# Patient Record
Sex: Female | Born: 1995 | Race: Black or African American | Hispanic: No | Marital: Married | Smoking: Current some day smoker
Health system: Southern US, Community
[De-identification: ages and names within clinical notes are randomized; demographics above are authoritative.]

## PROBLEM LIST (undated history)

## (undated) ENCOUNTER — Inpatient Hospital Stay (HOSPITAL_COMMUNITY): Payer: Self-pay

## (undated) DIAGNOSIS — N390 Urinary tract infection, site not specified: Secondary | ICD-10-CM

## (undated) DIAGNOSIS — Z789 Other specified health status: Secondary | ICD-10-CM

## (undated) DIAGNOSIS — G43909 Migraine, unspecified, not intractable, without status migrainosus: Secondary | ICD-10-CM

## (undated) HISTORY — PX: APPENDECTOMY: SHX54

---
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
## 2014-01-06 ENCOUNTER — Ambulatory Visit: Payer: Self-pay | Admitting: Physician Assistant

## 2014-01-15 ENCOUNTER — Emergency Department: Payer: Self-pay | Admitting: Emergency Medicine

## 2014-01-15 LAB — CBC
HCT: 38.3 % (ref 35.0–47.0)
HGB: 13.1 g/dL (ref 12.0–16.0)
MCH: 32.4 pg (ref 26.0–34.0)
MCHC: 34.2 g/dL (ref 32.0–36.0)
MCV: 95 fL (ref 80–100)
Platelet: 225 10*3/uL (ref 150–440)
RBC: 4.04 10*6/uL (ref 3.80–5.20)
RDW: 12.8 % (ref 11.5–14.5)
WBC: 6 10*3/uL (ref 3.6–11.0)

## 2014-01-15 LAB — URINALYSIS, COMPLETE
BACTERIA: NONE SEEN
BILIRUBIN, UR: NEGATIVE
BLOOD: NEGATIVE
Glucose,UR: NEGATIVE mg/dL (ref 0–75)
Ketone: NEGATIVE
NITRITE: NEGATIVE
Ph: 5 (ref 4.5–8.0)
Protein: NEGATIVE
Specific Gravity: 1.029 (ref 1.003–1.030)
WBC UR: 4 /HPF (ref 0–5)

## 2014-01-15 LAB — MAGNESIUM: Magnesium: 1.9 mg/dL

## 2014-01-15 LAB — COMPREHENSIVE METABOLIC PANEL
ALK PHOS: 88 U/L
ALT: 24 U/L (ref 12–78)
Albumin: 4.1 g/dL (ref 3.8–5.6)
Anion Gap: 2 — ABNORMAL LOW (ref 7–16)
BILIRUBIN TOTAL: 0.2 mg/dL (ref 0.2–1.0)
BUN: 15 mg/dL (ref 9–21)
CALCIUM: 8.8 mg/dL — AB (ref 9.0–10.7)
CREATININE: 0.69 mg/dL (ref 0.60–1.30)
Chloride: 112 mmol/L — ABNORMAL HIGH (ref 97–107)
Co2: 27 mmol/L — ABNORMAL HIGH (ref 16–25)
EGFR (African American): >60
GLUCOSE: 76 mg/dL (ref 65–99)
Osmolality: 281 (ref 275–301)
Potassium: 3.7 mmol/L (ref 3.3–4.7)
SGOT(AST): 25 U/L (ref 0–26)
Sodium: 141 mmol/L (ref 132–141)
Total Protein: 7.3 g/dL (ref 6.4–8.6)

## 2014-01-15 LAB — LIPASE, BLOOD: Lipase: 153 U/L (ref 73–393)

## 2014-01-15 LAB — PREGNANCY, URINE: Pregnancy Test, Urine: NEGATIVE m[IU]/mL

## 2014-01-23 ENCOUNTER — Emergency Department: Payer: Self-pay | Admitting: Emergency Medicine

## 2014-01-23 LAB — URINALYSIS, COMPLETE
Bacteria: NONE SEEN
Bilirubin,UR: NEGATIVE
Blood: NEGATIVE
GLUCOSE, UR: NEGATIVE mg/dL (ref 0–75)
Ketone: NEGATIVE
Leukocyte Esterase: NEGATIVE
Nitrite: NEGATIVE
Ph: 6 (ref 4.5–8.0)
Protein: NEGATIVE
SPECIFIC GRAVITY: 1.024 (ref 1.003–1.030)
Squamous Epithelial: 7
WBC UR: 1 /HPF (ref 0–5)

## 2014-01-23 LAB — COMPREHENSIVE METABOLIC PANEL
ALK PHOS: 87 U/L
ANION GAP: 4 — AB (ref 7–16)
Albumin: 3.8 g/dL (ref 3.8–5.6)
BUN: 12 mg/dL (ref 9–21)
Bilirubin,Total: 0.2 mg/dL (ref 0.2–1.0)
CREATININE: 0.85 mg/dL (ref 0.60–1.30)
Calcium, Total: 8.8 mg/dL — ABNORMAL LOW (ref 9.0–10.7)
Chloride: 107 mmol/L (ref 97–107)
Co2: 27 mmol/L — ABNORMAL HIGH (ref 16–25)
EGFR (African American): >60
EGFR (Non-African Amer.): >60
Glucose: 82 mg/dL (ref 65–99)
Osmolality: 275 (ref 275–301)
Potassium: 3.4 mmol/L (ref 3.3–4.7)
SGOT(AST): 18 U/L (ref 0–26)
SGPT (ALT): 18 U/L (ref 12–78)
SODIUM: 138 mmol/L (ref 132–141)
Total Protein: 7 g/dL (ref 6.4–8.6)

## 2014-01-23 LAB — CBC
HCT: 34.6 % — AB (ref 35.0–47.0)
HGB: 12.1 g/dL (ref 12.0–16.0)
MCH: 33.3 pg (ref 26.0–34.0)
MCHC: 35 g/dL (ref 32.0–36.0)
MCV: 95 fL (ref 80–100)
PLATELETS: 211 10*3/uL (ref 150–440)
RBC: 3.64 10*6/uL — ABNORMAL LOW (ref 3.80–5.20)
RDW: 12.5 % (ref 11.5–14.5)
WBC: 6.8 10*3/uL (ref 3.6–11.0)

## 2014-01-23 LAB — LIPASE, BLOOD: Lipase: 169 U/L (ref 73–393)

## 2014-02-15 IMAGING — US US OB COMP LESS 14 WK
1 series · 14 of 27 positions shown · non-contrast
Comparison: None.

CLINICAL DATA: Pain, pregnant.

OBSTETRIC <14 WK ULTRASOUND
TECHNIQUE: Transabdominal ultrasound was performed for evaluation
of the gestation as well as the maternal uterus and adnexal
regions.

[Series 1: us ob comp less 14 wks · 14 of 27 slices shown]
[im 1/27]
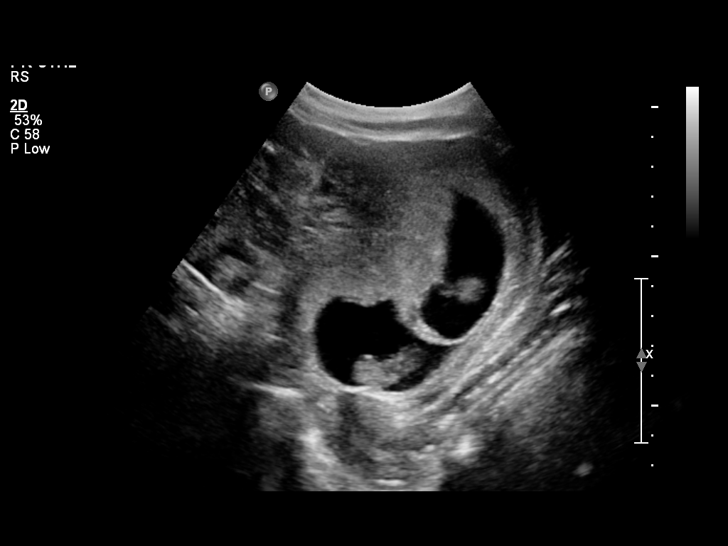
[im 3/27]
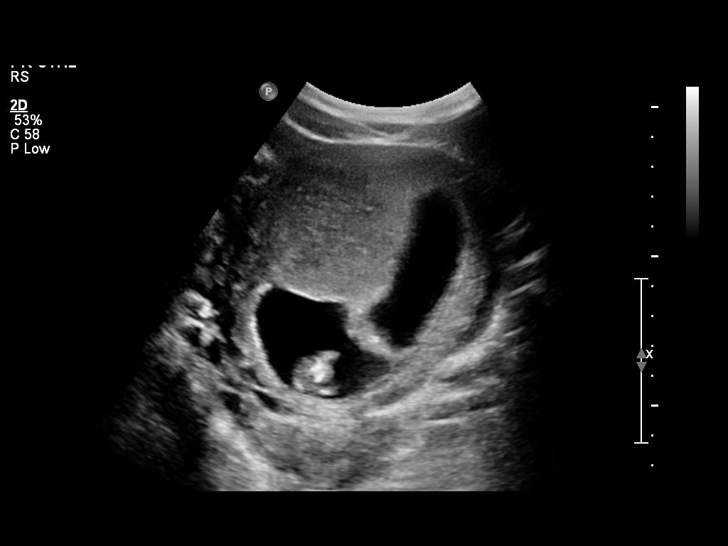
[im 5/27]
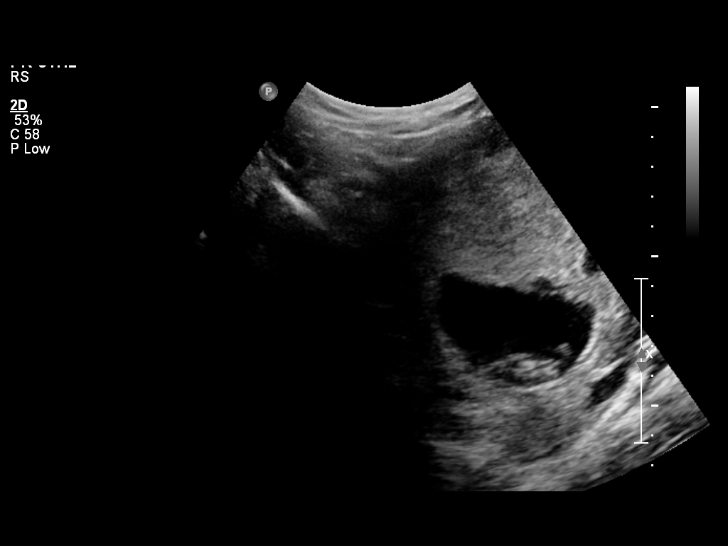
[im 7/27]
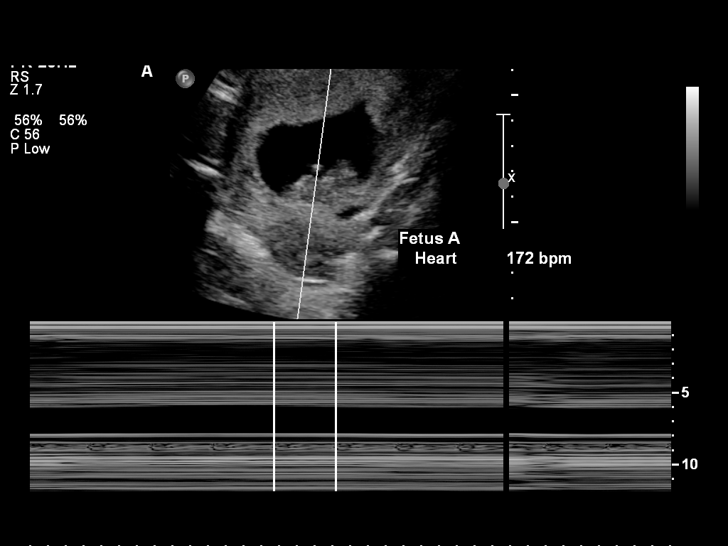
[im 9/27]
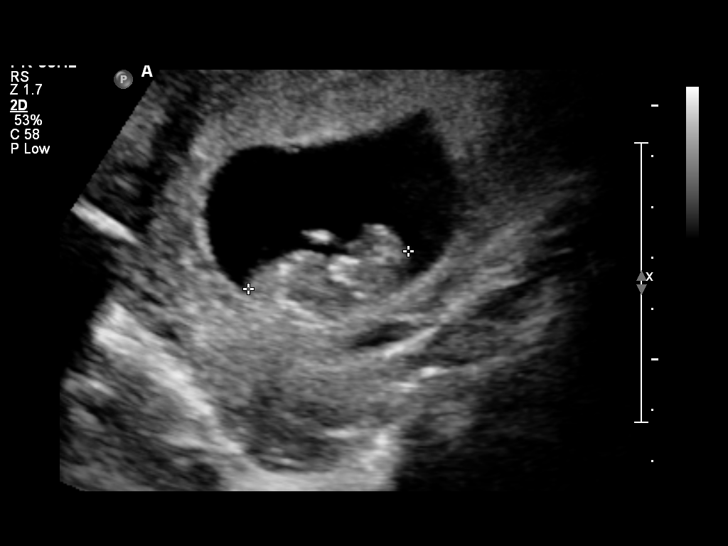
[im 11/27]
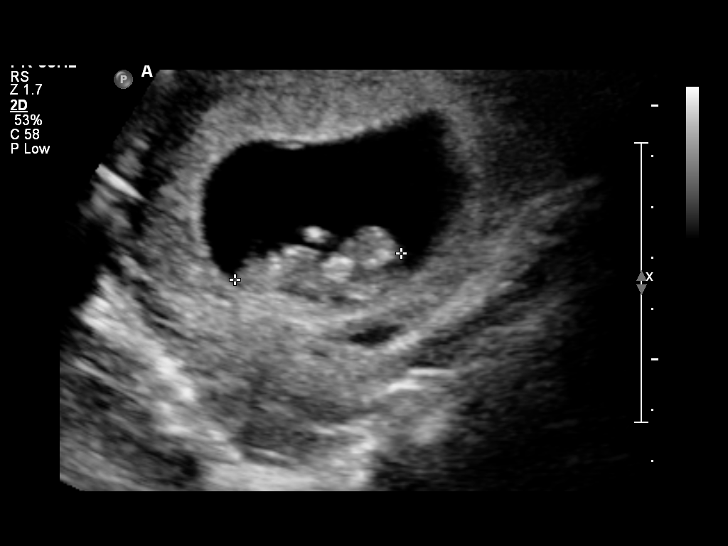
[im 13/27]
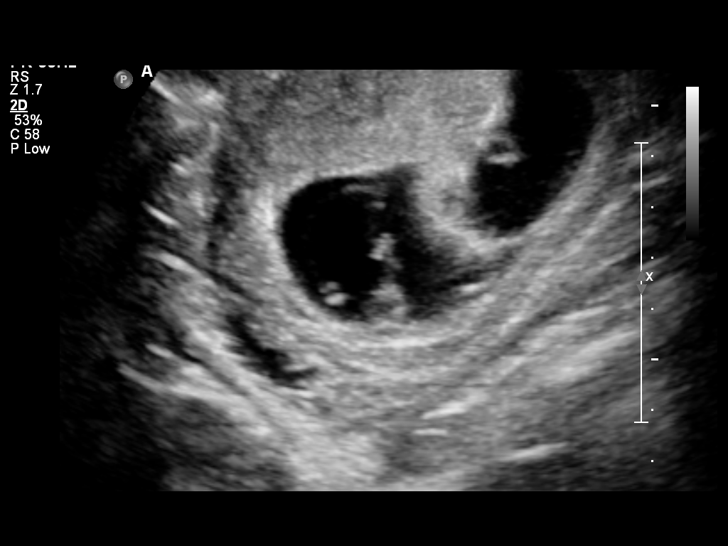
[im 15/27]
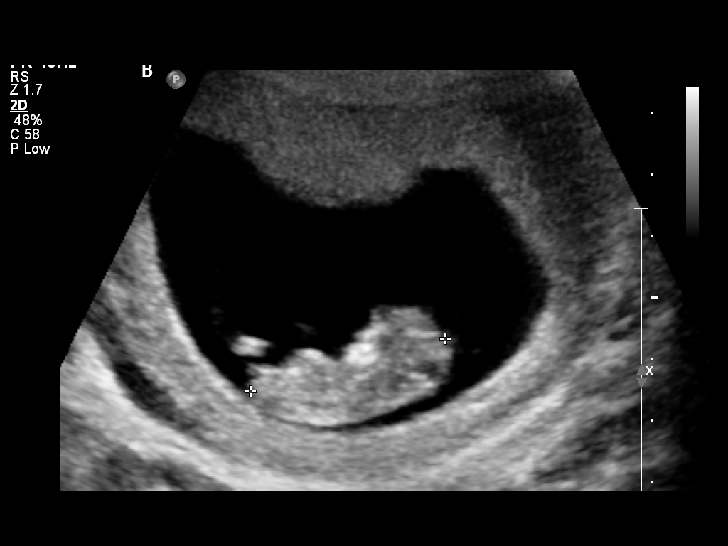
[im 17/27]
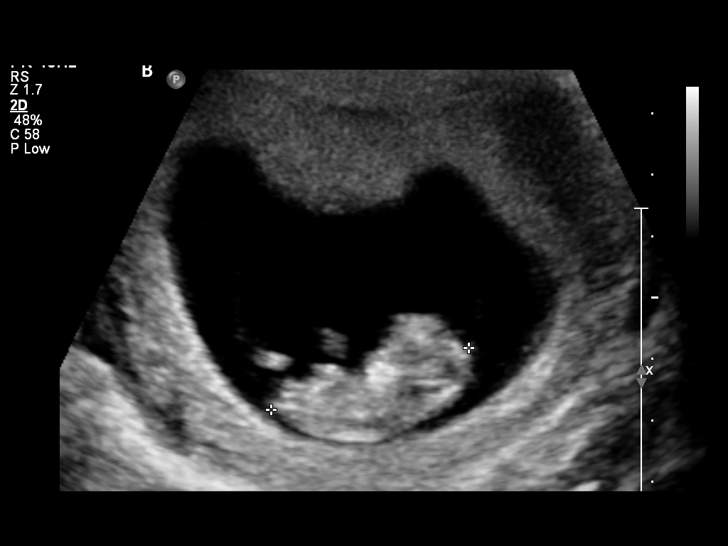
[im 19/27]
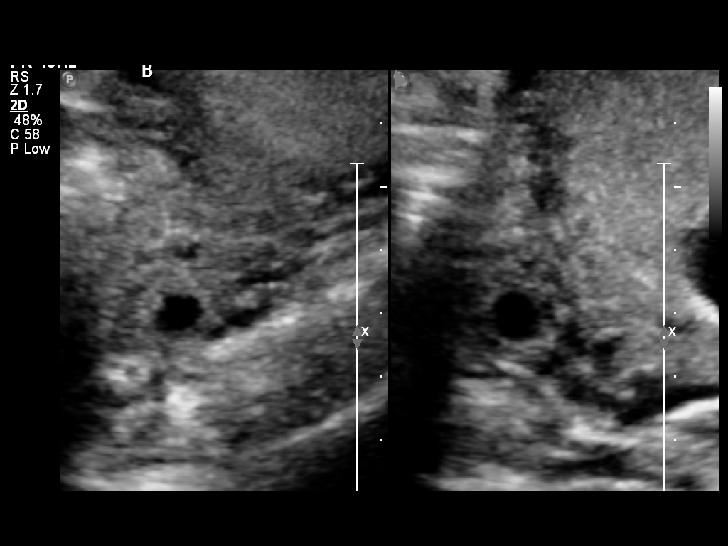
[im 21/27]
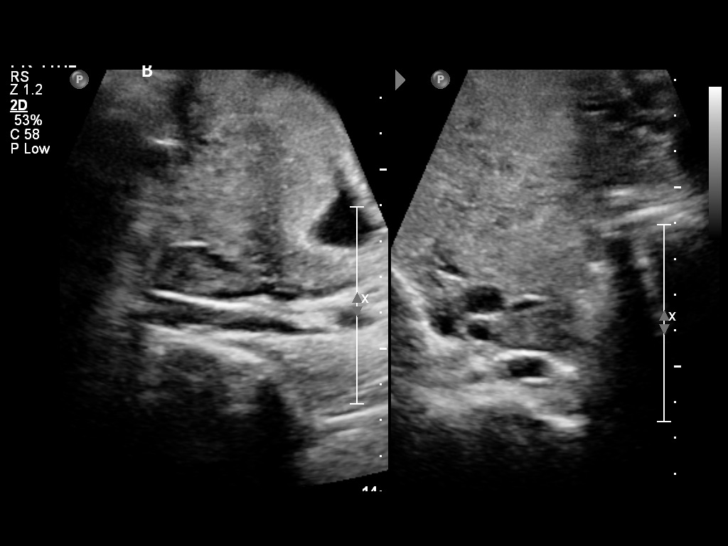
[im 23/27]
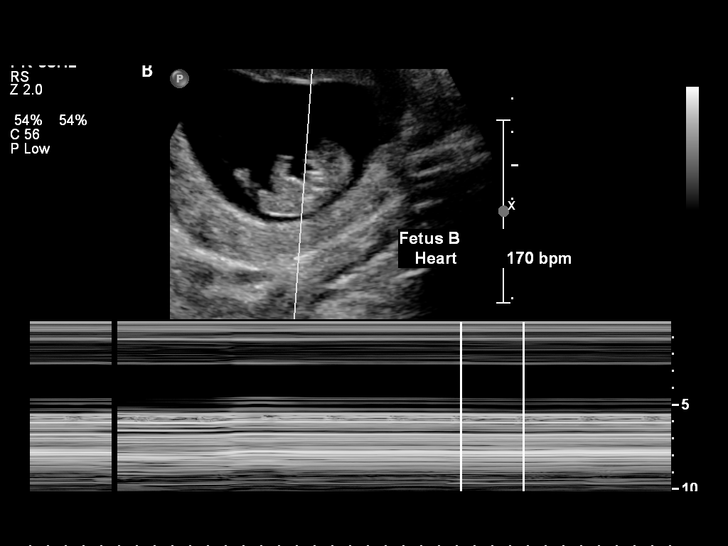
[im 25/27]
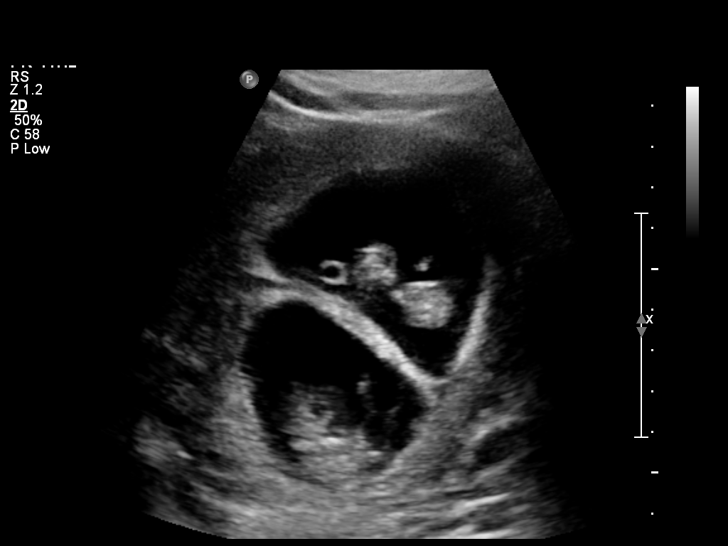
[im 27/27]
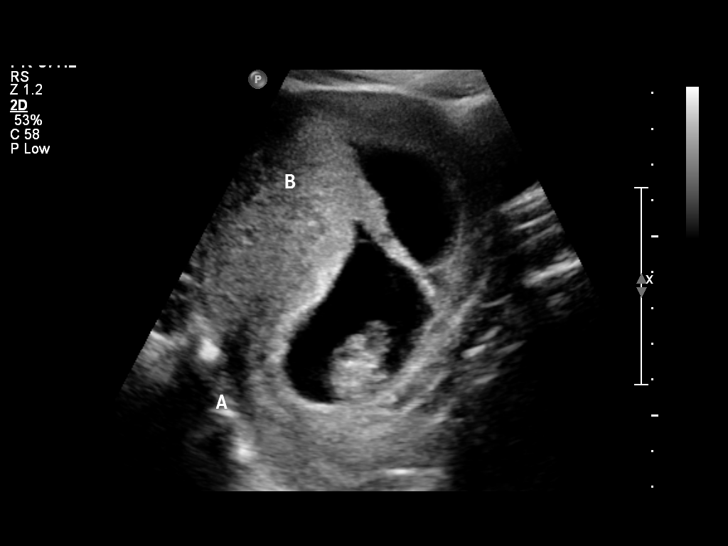

[14 of 27 positions shown; findings below may reference images not displayed]

Intrauterine gestational sac: 2 visualized, normal in shape.
Yolk sac: Identified
Embryo: Two identified
Cardiac Activity: Identified
Heart Rate: 172 and 170 bpm

CRL:  33 and 32 mm  10 w  1 d       US EDC: 02/10/2013

Maternal uterus/Adnexae:
No subchorionic hemorrhage.  Normal sonographic appearance to the
ovaries.
IMPRESSION: Twin gestation with cardiac activity documented for both.
Estimated ages of 10 weeks 1 day by crown-rump lengths.

## 2014-04-14 ENCOUNTER — Encounter: Payer: Self-pay | Admitting: Nurse Practitioner

## 2014-04-14 DIAGNOSIS — Z3046 Encounter for surveillance of implantable subdermal contraceptive: Secondary | ICD-10-CM

## 2014-05-06 ENCOUNTER — Encounter: Payer: Self-pay | Admitting: Obstetrics and Gynecology

## 2014-05-06 ENCOUNTER — Ambulatory Visit (INDEPENDENT_AMBULATORY_CARE_PROVIDER_SITE_OTHER): Payer: Medicaid Other | Admitting: Obstetrics and Gynecology

## 2014-05-06 VITALS — BP 123/69 | HR 83 | Ht 63.0 in | Wt 110.8 lb

## 2014-05-06 DIAGNOSIS — N898 Other specified noninflammatory disorders of vagina: Secondary | ICD-10-CM

## 2014-05-06 DIAGNOSIS — Z3009 Encounter for other general counseling and advice on contraception: Secondary | ICD-10-CM

## 2014-05-06 DIAGNOSIS — Z3046 Encounter for surveillance of implantable subdermal contraceptive: Secondary | ICD-10-CM

## 2014-05-06 MED ORDER — NORGESTIMATE-ETH ESTRADIOL 0.25-35 MG-MCG PO TABS: 1.0000 | ORAL_TABLET | Freq: Every day | ORAL | Status: DC

## 2014-05-06 NOTE — Progress Notes (Signed)
Patient would like nextplanon removed.  She is also having increased discharge with an odor that she would like checked out.

## 2014-05-06 NOTE — Progress Notes (Signed)
Patient ID: Debbie HarbourRaja [name], female   DOB: 05/01/1996, 18 y.o.   MRN: [id number] 18 yo G2P2003 requesting Nexplanon removal and plans to start OCP.  Patient also reports the presence of a vaginal discharge with odor    Removal Patient given informed consent for removal of her Implanon, time out was performed.  Signed copy in the chart.  Appropriate time out taken. Implanon site identified.  Area prepped in usual sterile fashon. One cc of 1% lidocaine was used to anesthetize the area at the distal end of the implant. A small stab incision was made right beside the implant on the distal portion.  The implanon rod was grasped using hemostats and removed without difficulty.  There was less than 3 cc blood loss. There were no complications.  A small amount of antibiotic ointment and steri-strips were applied over the small incision.  A pressure bandage was applied to reduce any bruising.  The patient tolerated the procedure well and was given post procedure instructions.  SSE: Normal vaginal mucosa and cervix. Normal white discharge Wet prep and GC/Chl cultures collected

## 2014-05-07 LAB — WET PREP FOR TRICH, YEAST, CLUE
Trich, Wet Prep: NONE SEEN
WBC, Wet Prep HPF POC: NONE SEEN
YEAST WET PREP: NONE SEEN

## 2014-05-07 MED ORDER — METRONIDAZOLE 500 MG PO TABS: 500.0000 mg | ORAL_TABLET | Freq: Two times a day (BID) | ORAL | Status: DC

## 2014-05-07 NOTE — Addendum Note (Signed)
Addended by: Catalina AntiguaONSTANT, PEGGY on: 05/07/2014 09:33 AM   Modules accepted: Orders

## 2014-05-09 LAB — GC/CHLAMYDIA PROBE AMP
CT Probe RNA: NEGATIVE
GC Probe RNA: NEGATIVE

## 2014-06-22 ENCOUNTER — Emergency Department: Payer: Self-pay | Admitting: Emergency Medicine

## 2014-07-13 IMAGING — US US OB FOLLOW-UP EACH ADDL GEST (MODIFY)
2 series · 15 of 28 positions shown · non-contrast
Comparison: none

[Series 1: us ob follow up · 1 of 2 slices shown (1 of 2)]
[im 1/2]
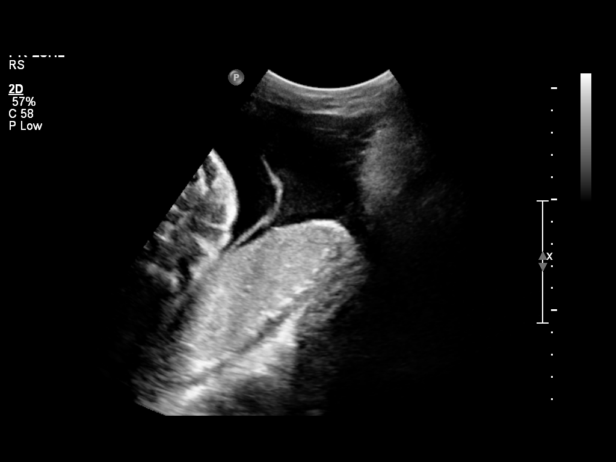

[Series 1: us ob follow up · 14 of 48 slices shown (2 of 2)]
[im 2/48]
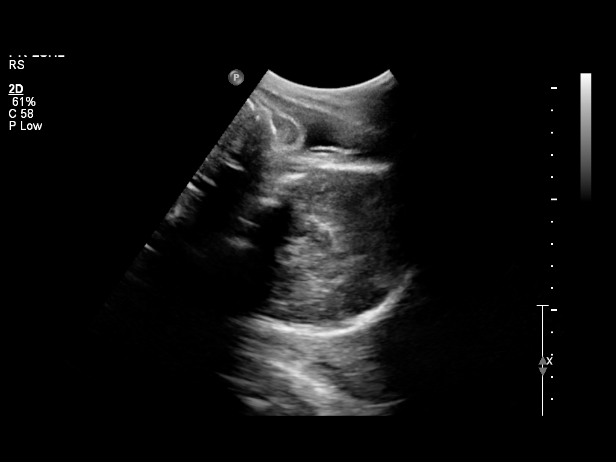
[im 6/48]
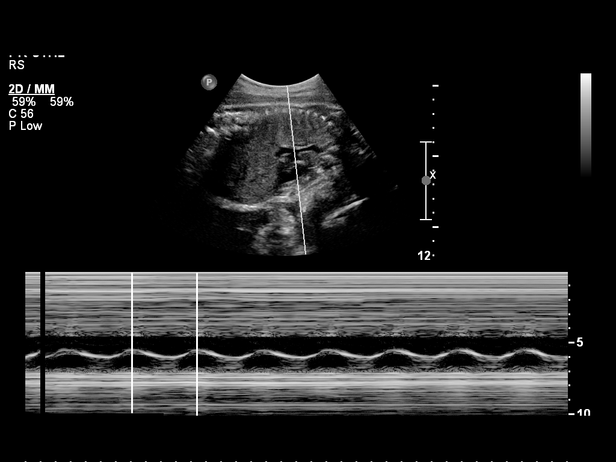
[im 10/48]
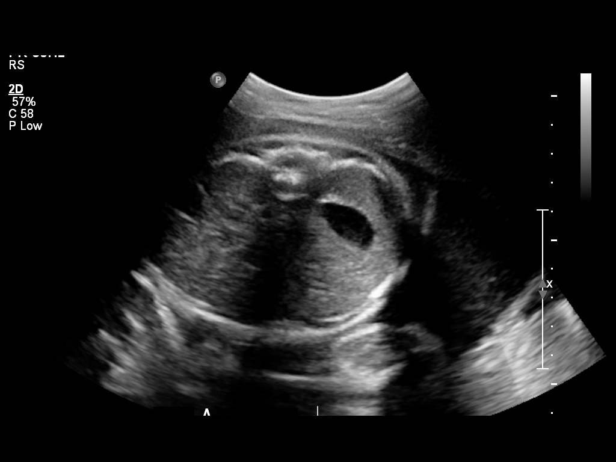
[im 13/48]
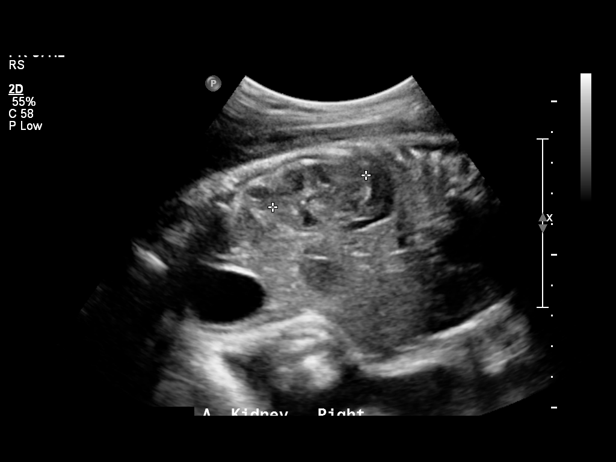
[im 17/48]
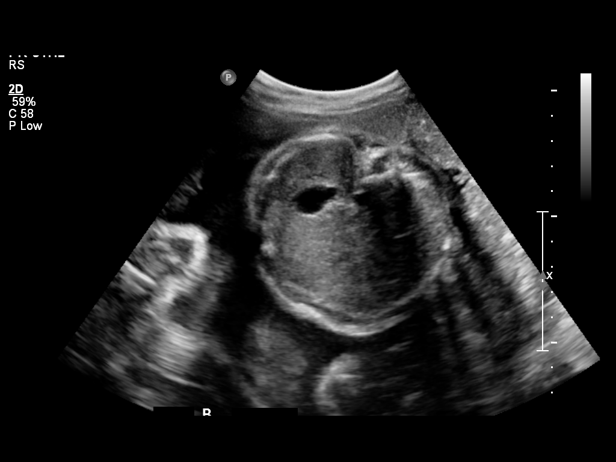
[im 20/48]
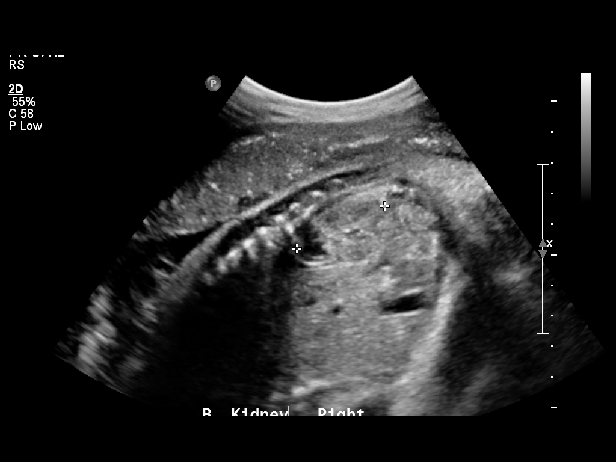
[im 24/48]
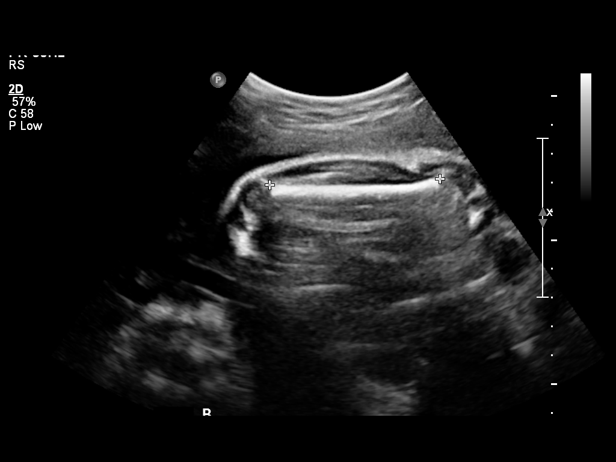
[im 26/48]
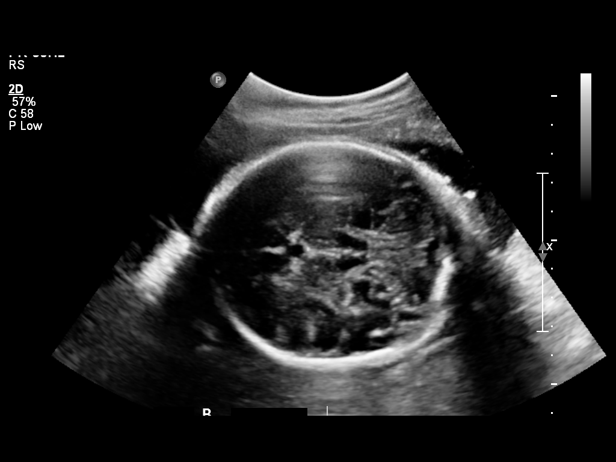
[im 29/48]
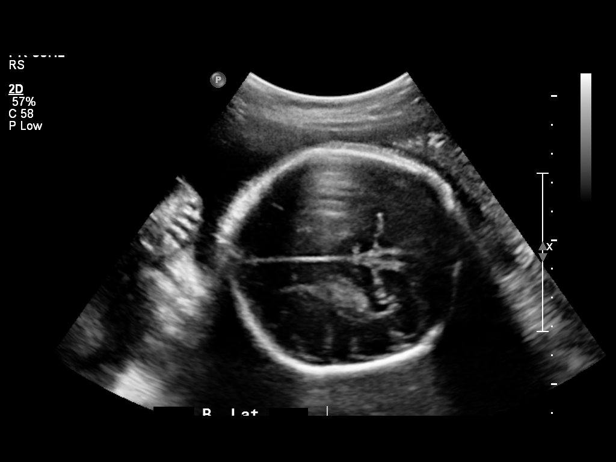
[im 33/48]
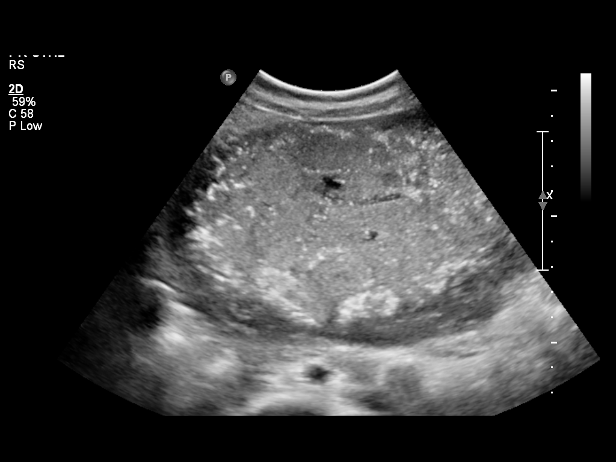
[im 37/48]
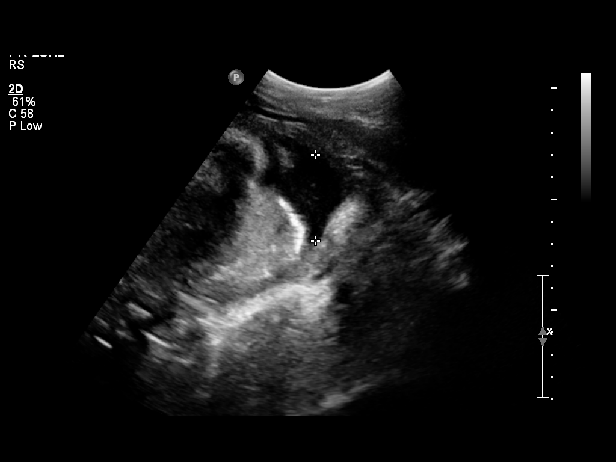
[im 40/48]
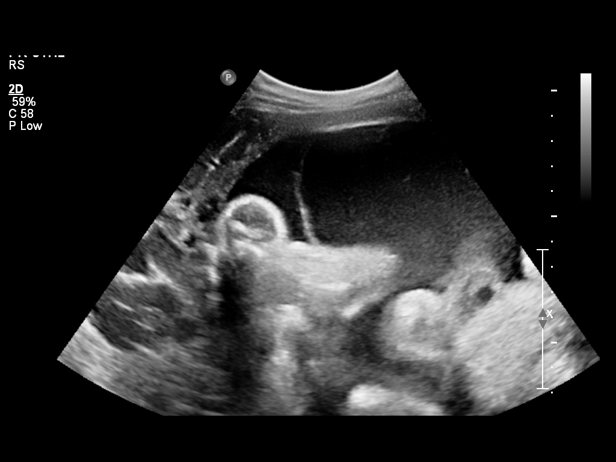
[im 44/48]
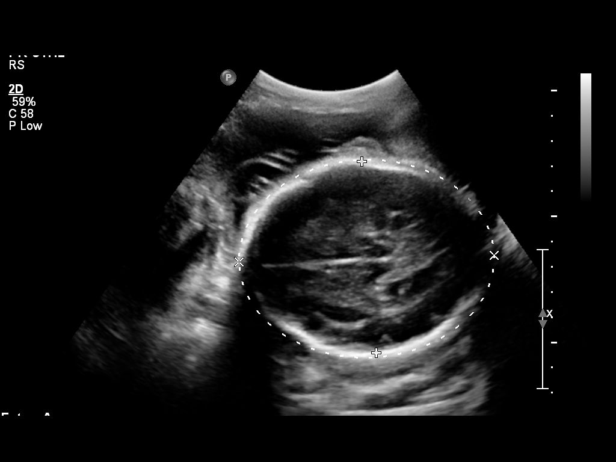
[im 48/48]
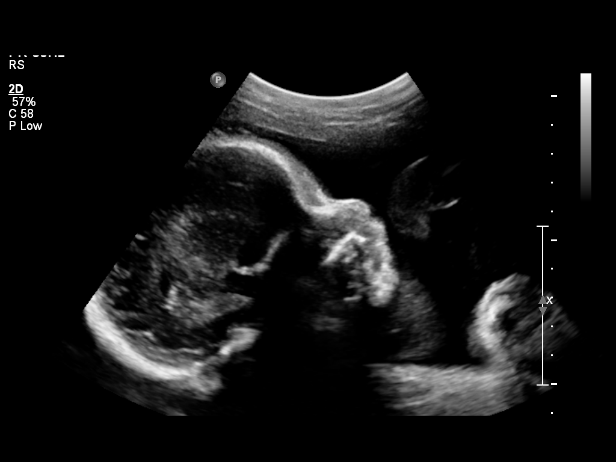

[15 of 28 positions shown; findings below may reference images not displayed]

OBSTETRICS REPORT
                      (Signed Final 12/12/2012 [DATE])

Service(s) Provided

 US OB FOLLOW UP                                       76816.1
 US OB FOLLOW UP ADDL GEST                             76816.2
Indications

 Twin gestation, Di-Di
 Family History of BONSU(Kaki)
 Cigarette smoker
Fetal Evaluation (Fetus A)

 Num Of Fetuses:    2
 Fetal Heart Rate:  139                         bpm
 Cardiac Activity:  Observed
 Fetal Lie:         INFERIOR Right Fetus
 Presentation:      Cephalic
 Placenta:          Posterior, above cervical
                    os
 P. Cord            Previously Visualized
 Insertion:

 Amniotic Fluid
 AFI FV:      Subjectively within normal limits
                                             Larg Pckt:     4.2  cm
Biometry (Fetus A)

 BPD:     77.7  mm    G. Age:   31w 1d                CI:        77.65   70 - 86
                                                      FL/HC:      21.6   19.3 -

 HC:     279.1  mm    G. Age:   30w 4d       10  %    HC/AC:      1.09   0.96 -

 AC:     255.8  mm    G. Age:   29w 5d       18  %    FL/BPD:     77.7   71 - 87
 FL:      60.4  mm    G. Age:   31w 3d       51  %    FL/AC:      23.6   20 - 24

 Est. FW:    1336  gm      3 lb 8 oz     47  %     FW Discordancy         6  %
Gestational Age (Fetus A)

 LMP:           30w 6d       Date:   05/10/12                 EDD:   02/14/13
 U/S Today:     30w 5d                                        EDD:   02/15/13
 Best:          30w 6d    Det. By:   LMP  (05/10/12)          EDD:   02/14/13
Anatomy (Fetus A)

 Cranium:          Previously seen        Aortic Arch:      Previously seen
 Fetal Cavum:      Previously seen        Ductal Arch:      Previously seen
 Ventricles:       Appears normal         Diaphragm:        Previously seen
 Choroid Plexus:   Previously seen        Stomach:          Appears normal, left
                                                            sided
 Cerebellum:       Previously seen        Abdomen:          Previously seen
 Posterior Fossa:  Previously seen        Abdominal Wall:   Previously seen
 Nuchal Fold:      Previously seen        Cord Vessels:     Previously seen
 Face:             Orbits previously      Kidneys:          Appear normal
                   seen
 Lips:             Previously seen        Bladder:          Appears normal
 Heart:            Previously seen        Spine:            Previously seen
 RVOT:             Previously seen        Lower             Previously seen
                                          Extremities:
 LVOT:             Previously seen        Upper             Previously seen
                                          Extremities:

 Other:  Female gender.  Heels and 5th digit previously seen.

Fetal Evaluation (Fetus B)

 Num Of Fetuses:    2
 Fetal Heart Rate:  135                         bpm
 Cardiac Activity:  Observed
 Fetal Lie:         SUPERIOR
 Presentation:      Transverse, head to
                    maternal right
 Placenta:          Posterior Fundal, above
                    cervical os
 P. Cord            Previously Visualized
 Insertion:

 Amniotic Fluid
 AFI FV:      Subjectively within normal limits
                                             Larg Pckt:     6.5  cm
Biometry (Fetus B)

 BPD:     75.4  mm    G. Age:   30w 2d                CI:        74.86   70 - 86
                                                      FL/HC:      21.3   19.3 -

 HC:     276.5  mm    G. Age:   30w 2d        7  %    HC/AC:      1.01   0.96 -

 AC:     273.2  mm    G. Age:   31w 3d       62  %    FL/BPD:     78.0   71 - 87
 FL:      58.8  mm    G. Age:   30w 5d       31  %    FL/AC:      21.5   20 - 24

 Est. FW:    4413  gm    3 lb 11 oz      57  %     FW Discordancy      0 \ 6 %
Gestational Age (Fetus B)

 LMP:           30w 6d       Date:   05/10/12                 EDD:   02/14/13
 U/S Today:     30w 5d                                        EDD:   02/15/13
 Best:          30w 6d    Det. By:   LMP  (05/10/12)          EDD:   02/14/13
Anatomy (Fetus B)

 Cranium:          Previously seen        Aortic Arch:      Previously seen
 Fetal Cavum:      Previously seen        Ductal Arch:      Previously seen
 Ventricles:       Appears normal         Diaphragm:        Previously seen
 Choroid Plexus:   Previously seen        Stomach:          Appears normal, left
                                                            sided
 Cerebellum:       Previously seen        Abdomen:          Previously seen
 Posterior Fossa:  Previously seen        Abdominal Wall:   Previously seen
 Nuchal Fold:      Previously seen        Cord Vessels:     Previously seen
 Face:             Orbits previously      Kidneys:          Appear normal
                   seen
 Lips:             Previously seen        Bladder:          Appears normal
 Heart:            Previously seen        Spine:            Previously seen
 RVOT:             Not well visualized    Lower             Previously seen
                                          Extremities:
 LVOT:             Previously seen        Upper             Previously seen
                                          Extremities:

 Other:  Female gender. Heels and 5th digit previously visualized.
Cervix Uterus Adnexa

 Cervical Length:   3         cm

 Cervix:       Normal appearance by transabdominal scan.

 Adnexa:     No abnormality visualized.
Impression

 Diamniotic, dichorionic twin gestation with twin A
 demonstrating an EGA by US of 30w 5 and twin B
 demonstrating an EGA by US of 30w 5d. This is correlated
 with expected EGA by LMP of 30w 6d. EFW is currently at
 the 47% for twin A and 57% for twin B with weight
 discrepancy due to a smaller abdominal circumference on
 twin A (18%). Continued close follow up for growth is
 recommended given the downward trending AC on twin A
 today.

 No late developing anatomic abnormalities are seen
 associated with the lateral ventricles, stomach, kidney or
 bladder for either twin. The four chamber heart could not be
 well seen on either twin.

 Subjectively and quantitatively normal amniotic fluid volume
 noted for both twins.

 Normal cervical length and appearance.

 questions or concerns.

## 2014-08-31 ENCOUNTER — Telehealth: Payer: Self-pay | Admitting: *Deleted

## 2014-08-31 DIAGNOSIS — N760 Acute vaginitis: Principal | ICD-10-CM

## 2014-08-31 DIAGNOSIS — B9689 Other specified bacterial agents as the cause of diseases classified elsewhere: Secondary | ICD-10-CM

## 2014-08-31 MED ORDER — METRONIDAZOLE 500 MG PO TABS: 500.0000 mg | ORAL_TABLET | Freq: Two times a day (BID) | ORAL | Status: DC

## 2014-08-31 NOTE — Telephone Encounter (Signed)
Patient is requesting a refill of Metronidazole for BV symptoms, she has changed her soap and is having this problem much less than before but sometimes she still has the increase discharge and odor.

## 2014-09-21 ENCOUNTER — Encounter: Payer: Self-pay | Admitting: Obstetrics and Gynecology

## 2014-10-13 ENCOUNTER — Encounter: Payer: Self-pay | Admitting: Nurse Practitioner

## 2014-10-13 ENCOUNTER — Ambulatory Visit (INDEPENDENT_AMBULATORY_CARE_PROVIDER_SITE_OTHER): Payer: Medicaid Other | Admitting: Nurse Practitioner

## 2014-10-13 VITALS — BP 117/74 | HR 79 | Ht 63.0 in | Wt 110.0 lb

## 2014-10-13 DIAGNOSIS — Z3041 Encounter for surveillance of contraceptive pills: Secondary | ICD-10-CM | POA: Diagnosis not present

## 2014-10-13 DIAGNOSIS — Z113 Encounter for screening for infections with a predominantly sexual mode of transmission: Secondary | ICD-10-CM

## 2014-10-13 DIAGNOSIS — Z7251 High risk heterosexual behavior: Secondary | ICD-10-CM | POA: Diagnosis not present

## 2014-10-13 DIAGNOSIS — Z309 Encounter for contraceptive management, unspecified: Secondary | ICD-10-CM | POA: Insufficient documentation

## 2014-10-13 LAB — POCT URINE PREGNANCY: PREG TEST UR: NEGATIVE

## 2014-10-13 NOTE — Progress Notes (Signed)
Would like screening for STI and pregnancy test as her last cycle was very light and she has not taken her ocp.

## 2014-10-13 NOTE — Addendum Note (Signed)
Addended by: Barbara CowerNOGUES, Lesta Limbert L on: 10/13/2014 04:12 PM   Modules accepted: Orders

## 2014-10-13 NOTE — Addendum Note (Signed)
Addended by: Barbara CowerNOGUES, Yadier Bramhall L on: 10/13/2014 04:01 PM   Modules accepted: Orders

## 2014-10-13 NOTE — Progress Notes (Signed)
Patient decided she would like to try the [employer] Ring.  One sample is given for patient to take home to see if she likes it and she will call me back to have it called in if she is interested in continuing this method of birth control.

## 2014-10-13 NOTE — Patient Instructions (Signed)

## 2014-10-13 NOTE — Progress Notes (Signed)
History:  Debbie EdinRaja Quanah [name] is a 18 y.o. [phone] who [facility] for [facility]D screening and contraception management. She states her boyfriend cheated on her and she is having an odorous discharge for the last few days. She has not used any OTC medications for the smell. She is having unprotected intercourse though she has birth control pills.   The following portions of the patient's history were reviewed and updated as appropriate: allergies, current medications, past family history, past medical history, past social history, past surgical history and problem list.  Review of Systems:    Objective:  Physical Exam BP 117/74 mmHg  Pulse 79  Ht 5\' 3"  (1.6 m)  Wt 110 lb (49.896 kg)  BMI 19.49 kg/m2  LMP 10/06/2014 (Exact Date)  Breastfeeding? No GENERAL: Well-developed, well-nourished female in no acute distress.  HEENT: Normocephalic, atraumatic.  ABDOMEN: Soft, nontender, nondistended. No organomegaly. Normal bowel sounds appreciated in all quadrants.  PELVIC: Normal external female genitalia. Vagina is pink and rugated.  Discharge is greenish with odor. . Normal cervix contour.  Uterus is normal in size. No adnexal mass , tenderness in left adnexa EXTREMITIES: No cyanosis, clubbing, or edema, 2+ distal pulses.   Labs and Imaging No results found.  Negative UPT  Assessment & Plan:  Assessment:  STD screening Contraception Management  Plans: Will do complete screening/ she will be called with results Discussion concerning importance of taking BCP's daily RTC prn  Debbie PhenixLinda M Vennie Waymire, NP 10/13/2014 11:43 AM

## 2014-10-14 ENCOUNTER — Telehealth: Payer: Self-pay | Admitting: *Deleted

## 2014-10-14 DIAGNOSIS — A749 Chlamydial infection, unspecified: Secondary | ICD-10-CM

## 2014-10-14 LAB — RPR

## 2014-10-14 LAB — WET PREP FOR TRICH, YEAST, CLUE
Clue Cells Wet Prep HPF POC: NONE SEEN
Trich, Wet Prep: NONE SEEN
Yeast Wet Prep HPF POC: NONE SEEN

## 2014-10-14 LAB — HIV ANTIBODY (ROUTINE TESTING W REFLEX): HIV 1&2 Ab, 4th Generation: NONREACTIVE

## 2014-10-14 LAB — HEPATITIS C ANTIBODY: HCV Ab: NEGATIVE

## 2014-10-14 LAB — GC/CHLAMYDIA PROBE AMP, URINE
Chlamydia, Swab/Urine, PCR: NEGATIVE
GC Probe Amp, Urine: POSITIVE — AB

## 2014-10-14 LAB — HEPATITIS B SURFACE ANTIGEN: Hepatitis B Surface Ag: NEGATIVE

## 2014-10-14 MED ORDER — AZITHROMYCIN 500 MG PO TABS: ORAL_TABLET | ORAL | Status: DC

## 2014-10-14 NOTE — Telephone Encounter (Signed)
Patient is notified of positive chlamydia results.  I have called in medication for her and she will take it today.  She will notify her partner to be treated and obstain from intercourse.

## 2014-10-19 ENCOUNTER — Ambulatory Visit: Payer: Medicaid Other

## 2014-10-20 ENCOUNTER — Ambulatory Visit (INDEPENDENT_AMBULATORY_CARE_PROVIDER_SITE_OTHER): Payer: Medicaid Other | Admitting: *Deleted

## 2014-10-20 ENCOUNTER — Ambulatory Visit: Payer: Medicaid Other

## 2014-10-20 DIAGNOSIS — A549 Gonococcal infection, unspecified: Secondary | ICD-10-CM

## 2014-10-20 MED ORDER — CEFTRIAXONE SODIUM 1 G IJ SOLR
250.0000 mg | Freq: Once | INTRAMUSCULAR | Status: AC
  Administered 2014-10-20: 250 mg via INTRAMUSCULAR

## 2014-10-29 ENCOUNTER — Other Ambulatory Visit: Payer: Self-pay | Admitting: *Deleted

## 2014-10-29 DIAGNOSIS — B379 Candidiasis, unspecified: Secondary | ICD-10-CM

## 2014-10-29 MED ORDER — FLUCONAZOLE 150 MG PO TABS: 150.0000 mg | ORAL_TABLET | Freq: Once | ORAL | Status: DC

## 2014-10-29 NOTE — Telephone Encounter (Signed)
Pt called and thinks she may have a yeast infection due to taking antibiotics.  I have sent in Diflucan to her pharmacy.

## 2014-11-01 ENCOUNTER — Ambulatory Visit: Payer: Self-pay | Admitting: Physician Assistant

## 2014-11-09 ENCOUNTER — Ambulatory Visit: Payer: Self-pay | Admitting: Physician Assistant

## 2015-02-11 ENCOUNTER — Telehealth: Payer: Self-pay | Admitting: *Deleted

## 2015-02-11 DIAGNOSIS — B9689 Other specified bacterial agents as the cause of diseases classified elsewhere: Secondary | ICD-10-CM

## 2015-02-11 DIAGNOSIS — N760 Acute vaginitis: Principal | ICD-10-CM

## 2015-02-11 MED ORDER — METRONIDAZOLE 500 MG PO TABS: 500.0000 mg | ORAL_TABLET | Freq: Two times a day (BID) | ORAL | Status: DC

## 2015-02-11 NOTE — Telephone Encounter (Signed)
Patient called and needed something called in for bacterial vaginosis.  I have sent in Flagyl to patients pharmacy. Pt aware.

## 2015-04-21 ENCOUNTER — Telehealth: Payer: Self-pay | Admitting: *Deleted

## 2015-04-21 DIAGNOSIS — B9689 Other specified bacterial agents as the cause of diseases classified elsewhere: Secondary | ICD-10-CM

## 2015-04-21 DIAGNOSIS — N760 Acute vaginitis: Principal | ICD-10-CM

## 2015-04-21 MED ORDER — METRONIDAZOLE 500 MG PO TABS: 500.0000 mg | ORAL_TABLET | Freq: Two times a day (BID) | ORAL | Status: DC

## 2015-04-21 NOTE — Telephone Encounter (Signed)
Patient called and needs something sent in for bacterial vaginosis.  I have sent in Flagyl to patients pharmacy.

## 2015-05-21 ENCOUNTER — Telehealth: Payer: Self-pay | Admitting: Family Medicine

## 2015-05-21 DIAGNOSIS — B379 Candidiasis, unspecified: Secondary | ICD-10-CM

## 2015-05-21 MED ORDER — FLUCONAZOLE 150 MG PO TABS: 150.0000 mg | ORAL_TABLET | Freq: Once | ORAL | Status: DC

## 2015-05-21 NOTE — Telephone Encounter (Signed)
Patient called requesting Diflucan for a yeast infection.  Rx routed to pharmacy per protocol.

## 2015-06-18 ENCOUNTER — Ambulatory Visit: Payer: Medicaid Other | Admitting: Obstetrics & Gynecology

## 2015-06-18 ENCOUNTER — Ambulatory Visit (INDEPENDENT_AMBULATORY_CARE_PROVIDER_SITE_OTHER): Payer: Medicaid Other | Admitting: Obstetrics & Gynecology

## 2015-06-18 ENCOUNTER — Encounter: Payer: Self-pay | Admitting: Obstetrics & Gynecology

## 2015-06-18 ENCOUNTER — Other Ambulatory Visit: Payer: Self-pay | Admitting: Obstetrics & Gynecology

## 2015-06-18 VITALS — BP 132/85 | HR 67 | Wt 115.0 lb

## 2015-06-18 DIAGNOSIS — N898 Other specified noninflammatory disorders of vagina: Secondary | ICD-10-CM | POA: Diagnosis not present

## 2015-06-18 DIAGNOSIS — N925 Other specified irregular menstruation: Secondary | ICD-10-CM

## 2015-06-18 DIAGNOSIS — Z3009 Encounter for other general counseling and advice on contraception: Secondary | ICD-10-CM

## 2015-06-18 DIAGNOSIS — Z113 Encounter for screening for infections with a predominantly sexual mode of transmission: Secondary | ICD-10-CM | POA: Diagnosis not present

## 2015-06-18 LAB — POCT URINE PREGNANCY: Preg Test, Ur: NEGATIVE

## 2015-06-18 NOTE — Progress Notes (Signed)
   Subjective:    Patient ID: Debbie [name], female    DOB: 25-May-1996, 19 y.o.   MRN: [id number]  HPI  19 yo S mixed P3 here because her current partner "cheated on" her. She has been having some vaginal discharge. She also said that her LMP was shorter than normal and she is worried that she could be pregnant.She declines contraception. I mentioned every type available and she declines each one. But also tells me that she doesn't want a pregnancy right now.  Review of Systems     Objective:   Physical Exam  WNWHtatooed F NAD Breathing, conversing, and ambulating normally EG- shaved, no lesions Thinnish clear vaginal discharge Bimanual exam reveals some tenderness on the right but no masses (I suspect ovulation pain)      Assessment & Plan:  Vaginal discharge- check cervical cultures and wet prep We had a long discussion about contraception.

## 2015-06-19 LAB — WET PREP, GENITAL
TRICH WET PREP: NONE SEEN
Yeast Wet Prep HPF POC: NONE SEEN

## 2015-06-19 LAB — GC/CHLAMYDIA PROBE AMP
CT Probe RNA: NEGATIVE
GC PROBE AMP APTIMA: NEGATIVE

## 2015-06-23 ENCOUNTER — Telehealth: Payer: Self-pay | Admitting: *Deleted

## 2015-06-23 DIAGNOSIS — N898 Other specified noninflammatory disorders of vagina: Secondary | ICD-10-CM

## 2015-06-23 MED ORDER — METRONIDAZOLE 500 MG PO TABS: 500.0000 mg | ORAL_TABLET | Freq: Two times a day (BID) | ORAL | Status: DC

## 2015-06-23 NOTE — Telephone Encounter (Signed)
Informed pt of results of wet prep and GC/CT, pt is c/o foul smelling discharge.  Sent Flagyl  to pharmacy for tx of BV.

## 2015-07-22 ENCOUNTER — Telehealth: Payer: Self-pay | Admitting: *Deleted

## 2015-07-22 DIAGNOSIS — N898 Other specified noninflammatory disorders of vagina: Secondary | ICD-10-CM

## 2015-07-22 MED ORDER — METRONIDAZOLE 500 MG PO TABS: 500.0000 mg | ORAL_TABLET | Freq: Two times a day (BID) | ORAL | Status: DC

## 2015-07-22 NOTE — Telephone Encounter (Signed)
Patient called and said some of her medicine fell down the sink into the water and she needs more medicine called in because she feels like she still has the infection.  I have sent in more medicine but made patient aware that if symptoms persist after finishing medicine that she needs to schedule another office visit.

## 2015-07-27 ENCOUNTER — Telehealth: Payer: Self-pay | Admitting: Obstetrics & Gynecology

## 2015-07-27 NOTE — Telephone Encounter (Signed)
Pt was wondering if she could have something called in for a yeast infection? Pt uses the [facility] in [place].

## 2015-07-27 NOTE — Telephone Encounter (Signed)
Pt still c/o vaginal discharge, had rx a week ago and spilled part of the medication in the sink of water.  Pt had called and spoke to Chrissie, CMA and she had called another rx into pharmacy but was unaware that the rx had been called in.  Told pt to check with pharmacy and pick up rx.  Pt to call back if there is any trouble with the rx or if she continues to be symptomatic after treatment.

## 2015-08-07 IMAGING — CR DG TOE 5TH LEFT
1 series · 4 of 4 positions shown · non-contrast
Comparison: None.

CLINICAL DATA: Pain secondary to blunt trauma last night.

EXAM:
DG TOE 5TH LEFT

[Series 1: oblique · 0.17mm/px · 4 of 4 slices shown]
[im 1/4]
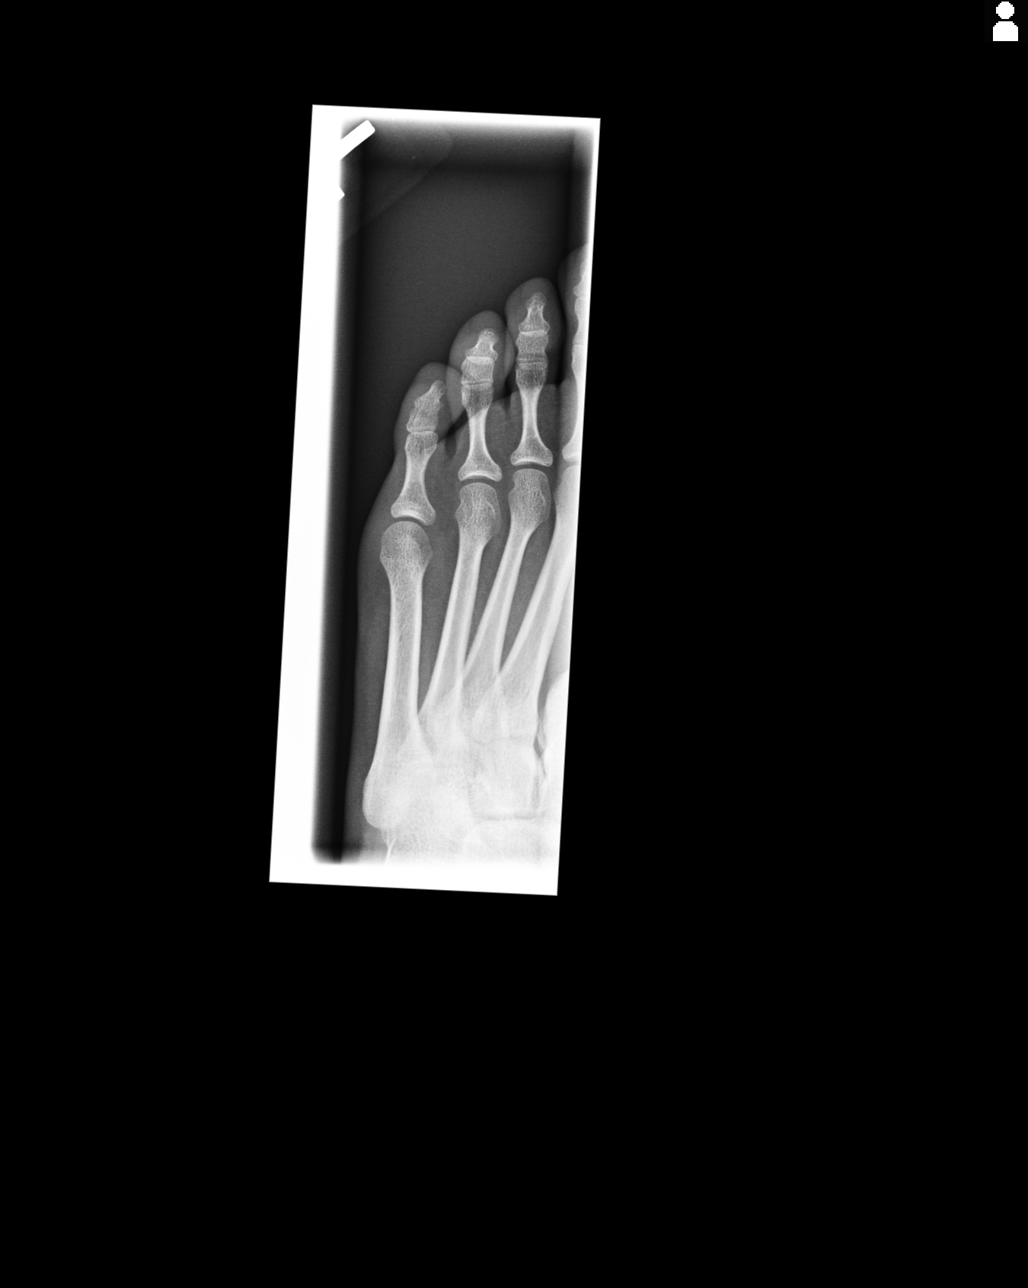
[im 2/4]
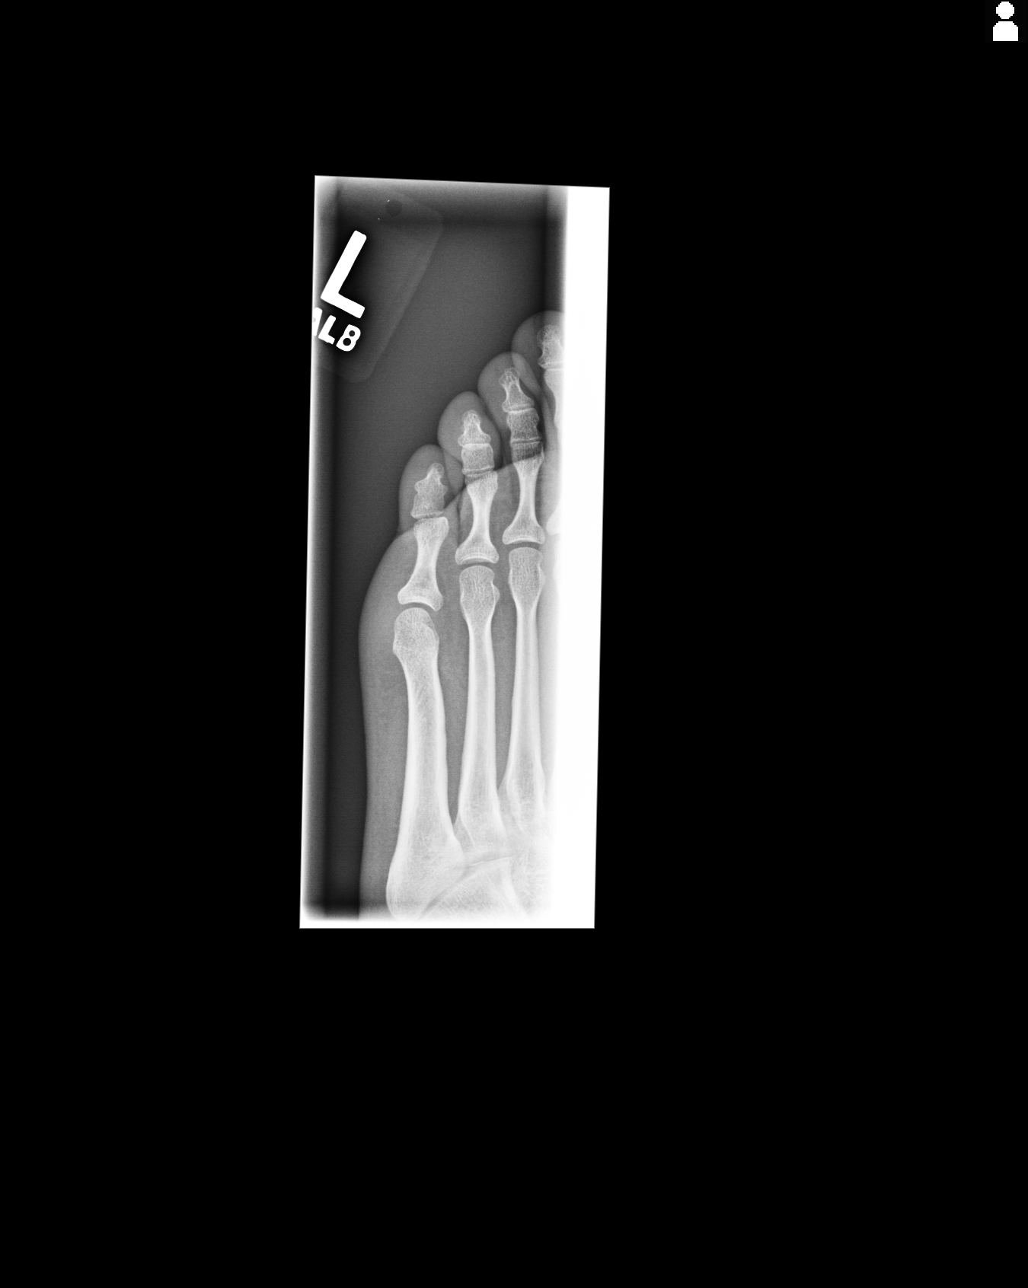
[im 3/4]
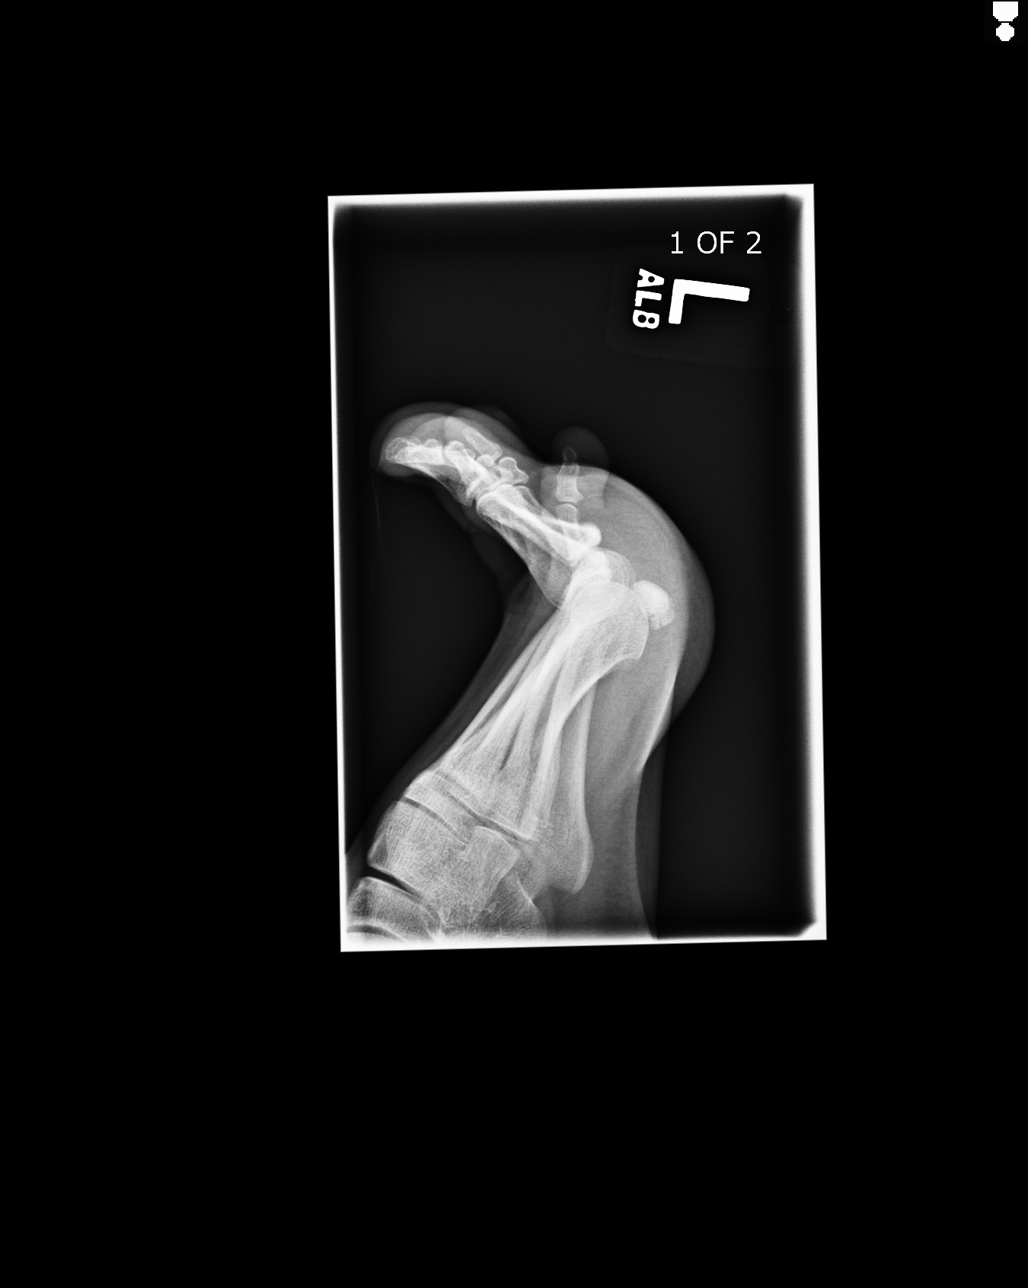
[im 4/4]
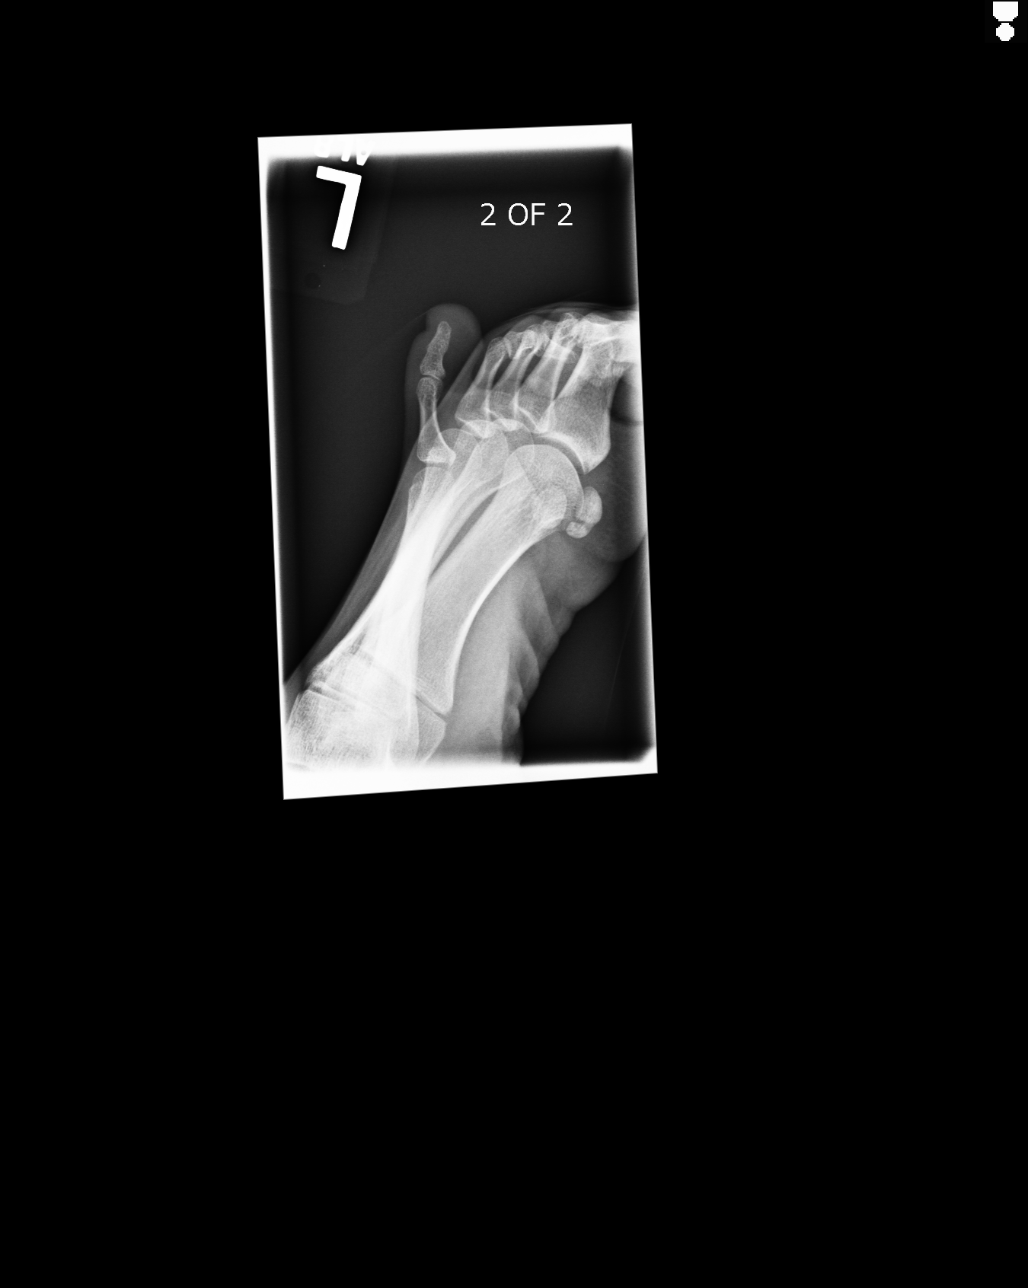

[4 of 4 positions shown; findings below may reference images not displayed]

FINDINGS: There is no evidence of fracture or dislocation. There is no
evidence of arthropathy or other focal bone abnormality. Soft
tissues are unremarkable.
IMPRESSION: Normal exam.

## 2015-08-26 ENCOUNTER — Ambulatory Visit: Payer: Medicaid Other | Admitting: Obstetrics and Gynecology

## 2015-08-31 ENCOUNTER — Ambulatory Visit (INDEPENDENT_AMBULATORY_CARE_PROVIDER_SITE_OTHER): Payer: Medicaid Other | Admitting: Obstetrics & Gynecology

## 2015-08-31 ENCOUNTER — Other Ambulatory Visit (HOSPITAL_COMMUNITY)
Admission: RE | Admit: 2015-08-31 | Discharge: 2015-08-31 | Disposition: A | Discharge status: Home / Self Care | Payer: Medicaid Other | Source: Ambulatory Visit | Attending: Obstetrics & Gynecology | Admitting: Obstetrics & Gynecology

## 2015-08-31 ENCOUNTER — Encounter: Payer: Self-pay | Admitting: Obstetrics & Gynecology

## 2015-08-31 VITALS — BP 98/63 | HR 73 | Wt 119.0 lb

## 2015-08-31 DIAGNOSIS — N898 Other specified noninflammatory disorders of vagina: Secondary | ICD-10-CM | POA: Diagnosis not present

## 2015-08-31 DIAGNOSIS — Z3202 Encounter for pregnancy test, result negative: Secondary | ICD-10-CM

## 2015-08-31 DIAGNOSIS — Z113 Encounter for screening for infections with a predominantly sexual mode of transmission: Secondary | ICD-10-CM

## 2015-08-31 DIAGNOSIS — B9689 Other specified bacterial agents as the cause of diseases classified elsewhere: Secondary | ICD-10-CM

## 2015-08-31 DIAGNOSIS — Z3041 Encounter for surveillance of contraceptive pills: Secondary | ICD-10-CM | POA: Diagnosis not present

## 2015-08-31 DIAGNOSIS — N760 Acute vaginitis: Secondary | ICD-10-CM

## 2015-08-31 DIAGNOSIS — A499 Bacterial infection, unspecified: Secondary | ICD-10-CM

## 2015-08-31 LAB — POCT URINE PREGNANCY: Preg Test, Ur: NEGATIVE

## 2015-08-31 MED ORDER — NORGESTIMATE-ETH ESTRADIOL 0.25-35 MG-MCG PO TABS: 1.0000 | ORAL_TABLET | Freq: Every day | ORAL | Status: DC

## 2015-08-31 MED ORDER — METRONIDAZOLE 500 MG PO TABS: 500.0000 mg | ORAL_TABLET | Freq: Two times a day (BID) | ORAL | Status: DC

## 2015-08-31 NOTE — Progress Notes (Signed)
   CLINIC ENCOUNTER NOTE  History:  19 y.o. [id number] here today for evaluati51on of recurrent, malodorous, yellow vaginal discharge for a few days, and to discuss contraception. She denies any abnormal vaginal bleeding, pelvic pain or other concerns.  Desires pregnancy test as last period came one week late.  Past Medical History  Diagnosis Date  . No pertinent past medical history   . Urinary tract infection     Past Surgical History  Procedure Laterality Date  . Appendectomy      The following portions of the patient's history were reviewed and updated as appropriate: allergies, current medications, past family history, past medical history, past social history, past surgical history and problem list.    Review of Systems:  Pertinent items noted in HPI and remainder of comprehensive ROS otherwise negative.  Objective:  Physical Exam BP 98/63 mmHg  Pulse 73  Wt 119 lb (53.978 kg)  LMP 08/13/2015 CONSTITUTIONAL: Well-developed, well-nourished female in no acute distress.  HENT:  Normocephalic, atraumatic. External right and left ear normal. Oropharynx is clear and moist EYES: Conjunctivae and EOM are normal. Pupils are equal, round, and reactive to light. No scleral icterus.  NECK: Normal range of motion, supple, no masses SKIN: Skin is warm and dry. No rash noted. Not diaphoretic. No erythema. No pallor. NEUROLGIC: Alert and oriented to person, place, and time. Normal reflexes, muscle tone coordination. No cranial nerve deficit noted. PSYCHIATRIC: Normal mood and affect. Normal behavior. Normal judgment and thought content. CARDIOVASCULAR: Normal heart rate noted RESPIRATORY: Effort and breath sounds normal, no problems with respiration noted ABDOMEN: Soft, no distention noted.   PELVIC: Normal appearing external genitalia; normal appearing vaginal mucosa and cervix.  Yellow, malodorus discharge noted; samples obtained for testing.  Normal uterine size, no other palpable masses,  no uterine or adnexal tenderness. MUSCULOSKELETAL: Normal range of motion. No edema noted.  Labs and Imaging POCT UPT negative   Assessment & Plan:  1. 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  Proper vulvar hygiene emphasized: discussed avoidance of perfumed soaps, detergents, lotions and any type of douches; in addition to wearing cotton underwear and no underwear at night.  Also recommended cleaning front to back, voiding and cleaning up after intercourse.   Will follow up all labs and manage accordingly. - Wet prep, genital - GC/Chlamydia probe amp (E. Lopez)not at [facility]; Standing - metroNIDAZOLE (FLAGYL) 500 MG tablet; Take 1 tablet (500 mg total) by mouth 2 (two) times daily.  Dispense: 14 tablet; Refill: 0   2. Encounter for surveillance of contraceptive pills Advised LARCs, discussed benefits in details, patient declined.  Wants OCPs (has not taken these even though they were prescribed in past).  Told to download app on phone to remind her to take it daily.  Condoms advised for safe sex. OCPs re-prescribed today.  Negative pregnancy test today, patient reassured. - norgestimate-ethinyl estradiol (ORTHO-CYCLEN,SPRINTEC,PREVIFEM) 0.25-35 MG-MCG tablet; Take 1 tablet by mouth daily.  Dispense: 1 Package; Refill: 11   Routine preventative health maintenance measures emphasized. Please refer to After Visit Summary for other counseling recommendations.  Return if symptoms worsen or fail to improve.    Jaynie Collins, MD, FACOG Attending Obstetrician & Gynecologist, [facility]

## 2015-08-31 NOTE — Patient Instructions (Signed)
Bacterial Vaginosis °Bacterial vaginosis is a vaginal infection that occurs when the normal balance of bacteria in the vagina is disrupted. It results from an overgrowth of certain bacteria. This is the most common vaginal infection in women of childbearing age. Treatment is important to prevent complications, especially in pregnant women, as it can cause a premature delivery. °CAUSES  °Bacterial vaginosis is caused by an increase in harmful bacteria that are normally present in smaller amounts in the vagina. Several different kinds of bacteria can cause bacterial vaginosis. However, the reason that the condition develops is not fully understood. °RISK FACTORS °Certain activities or behaviors can put you at an increased risk of developing bacterial vaginosis, including: °· Having a new sex partner or multiple sex partners. °· Douching. °· Using an intrauterine device (IUD) for contraception. °Women do not get bacterial vaginosis from toilet seats, bedding, swimming pools, or contact with objects around them. °SIGNS AND SYMPTOMS  °Some women with bacterial vaginosis have no signs or symptoms. Common symptoms include: °· Grey vaginal discharge. °· A fishlike odor with discharge, especially after sexual intercourse. °· Itching or burning of the vagina and vulva. °· Burning or pain with urination. °DIAGNOSIS  °Your health care provider will take a medical history and examine the vagina for signs of bacterial vaginosis. A sample of vaginal fluid may be taken. Your health care provider will look at this sample under a microscope to check for bacteria and abnormal cells. A vaginal pH test may also be done.  °TREATMENT  °Bacterial vaginosis may be treated with antibiotic medicines. These may be given in the form of a pill or a vaginal cream. A second round of antibiotics may be prescribed if the condition comes back after treatment. Because bacterial vaginosis increases your risk for sexually transmitted diseases, getting  treated can help reduce your risk for chlamydia, gonorrhea, HIV, and herpes. °HOME CARE INSTRUCTIONS  °· Only take over-the-counter or prescription medicines as directed by your health care provider. °· If antibiotic medicine was prescribed, take it as directed. Make sure you finish it even if you start to feel better. °· Tell all sexual partners that you have a vaginal infection. They should see their health care provider and be treated if they have problems, such as a mild rash or itching. °· During treatment, it is important that you follow these instructions: °¨ Avoid sexual activity or use condoms correctly. °¨ Do not douche. °¨ Avoid alcohol as directed by your health care provider. °¨ Avoid breastfeeding as directed by your health care provider. °SEEK MEDICAL CARE IF:  °· Your symptoms are not improving after 3 days of treatment. °· You have increased discharge or pain. °· You have a fever. °MAKE SURE YOU:  °· Understand these instructions. °· Will watch your condition. °· Will get help right away if you are not doing well or get worse. °FOR MORE INFORMATION  °Centers for Disease Control and Prevention, Division of STD Prevention: www.cdc.gov/std °American Sexual Health Association (ASHA): www.ashastd.org  °  °This information is not intended to replace advice given to you by your health care provider. Make sure you discuss any questions you have with your health care provider. °  °Document Released: 11/06/2005 Document Revised: 11/27/2014 Document Reviewed: 06/18/2013 °Elsevier Interactive Patient Education ©2016 [employer] Inc[employer] ° ° ° °Vaginitis °Vaginitis is an inflammation of the vagina. It is most often caused by a change in the normal balance of the bacteria and yeast that live in the vagina. This change in balance   causes an overgrowth of certain bacteria or yeast, which causes the inflammation. There are different types of vaginitis, but the most common types are: °· Bacterial vaginosis. °· Yeast  infection (candidiasis). °· Trichomoniasis vaginitis. This is a sexually transmitted infection (STI). °· Viral vaginitis. °· Atrophic vaginitis. °· Allergic vaginitis. °CAUSES  °The cause depends on the type of vaginitis. Vaginitis can be caused by: °· Bacteria (bacterial vaginosis). °· Yeast (yeast infection). °· A parasite (trichomoniasis vaginitis) °· A virus (viral vaginitis). °· Low hormone levels (atrophic vaginitis). Low hormone levels can occur during pregnancy, breastfeeding, or after menopause. °· Irritants, such as bubble baths, scented tampons, and feminine sprays (allergic vaginitis). °Other factors can change the normal balance of the yeast and bacteria that live in the vagina. These include: °· Antibiotic medicines. °· Poor hygiene. °· Diaphragms, vaginal sponges, spermicides, birth control pills, and intrauterine devices (IUD). °· Sexual intercourse. °· Infection. °· Uncontrolled diabetes. °· A weakened immune system. °SYMPTOMS  °Symptoms can vary depending on the cause of the vaginitis. Common symptoms include: °· Abnormal vaginal discharge. °¨ The discharge is white, gray, or yellow with bacterial vaginosis. °¨ The discharge is thick, white, and cheesy with a yeast infection. °¨ The discharge is frothy and yellow or greenish with trichomoniasis. °· A bad vaginal odor. °¨ The odor is fishy with bacterial vaginosis. °· Vaginal itching, pain, or swelling. °· Painful intercourse. °· Pain or burning when urinating. °Sometimes, there are no symptoms. °TREATMENT  °Treatment will vary depending on the type of infection.  °· Bacterial vaginosis and trichomoniasis are often treated with antibiotic creams or pills. °· Yeast infections are often treated with antifungal medicines, such as vaginal creams or suppositories. °· Viral vaginitis has no cure, but symptoms can be treated with medicines that relieve discomfort. Your sexual partner should be treated as well. °· Atrophic vaginitis may be treated with an  estrogen cream, pill, suppository, or vaginal ring. If vaginal dryness occurs, lubricants and moisturizing creams may help. You may be told to avoid scented soaps, sprays, or douches. °· Allergic vaginitis treatment involves quitting the use of the product that is causing the problem. Vaginal creams can be used to treat the symptoms. °HOME CARE INSTRUCTIONS  °· Take all medicines as directed by your caregiver. °· Keep your genital area clean and dry. Avoid soap and only rinse the area with water. °· Avoid douching. It can remove the healthy bacteria in the vagina. °· Do not use tampons or have sexual intercourse until your vaginitis has been treated. Use sanitary pads while you have vaginitis. °· Wipe from front to back. This avoids the spread of bacteria from the rectum to the vagina. °· Let air reach your genital area. °¨ Wear cotton underwear to decrease moisture buildup. °¨ Avoid wearing underwear while you sleep until your vaginitis is gone. °¨ Avoid tight pants and underwear or nylons without a cotton panel. °¨ Take off wet clothing (especially bathing suits) as soon as possible. °· Use mild, non-scented products. Avoid using irritants, such as: °¨ Scented feminine sprays. °¨ Fabric softeners. °¨ Scented detergents. °¨ Scented tampons. °¨ Scented soaps or bubble baths. °· Practice safe sex and use condoms. Condoms may prevent the spread of trichomoniasis and viral vaginitis. °SEEK MEDICAL CARE IF:  °· You have abdominal pain. °· You have a fever or persistent symptoms for more than 2-3 days. °· You have a fever and your symptoms suddenly get worse. °  °This information is not intended to replace advice given   to you by your health care provider. Make sure you discuss any questions you have with your health care provider. °  °Document Released: 09/03/2007 Document Revised: 03/23/2015 Document Reviewed: 04/18/2012 °Elsevier Interactive Patient Education ©2016 Elsevier Inc. ° °

## 2015-09-01 LAB — WET PREP, GENITAL
Trich, Wet Prep: NONE SEEN
Yeast Wet Prep HPF POC: NONE SEEN

## 2015-09-02 LAB — GC/CHLAMYDIA PROBE AMP (~~LOC~~) NOT AT ARMC
Chlamydia: NEGATIVE
Neisseria Gonorrhea: NEGATIVE

## 2015-10-04 ENCOUNTER — Other Ambulatory Visit (INDEPENDENT_AMBULATORY_CARE_PROVIDER_SITE_OTHER): Payer: Self-pay | Admitting: *Deleted

## 2015-10-04 ENCOUNTER — Other Ambulatory Visit (HOSPITAL_COMMUNITY)
Admission: RE | Admit: 2015-10-04 | Discharge: 2015-10-04 | Disposition: A | Discharge status: Home / Self Care | Payer: Medicaid Other | Source: Ambulatory Visit | Attending: Obstetrics & Gynecology | Admitting: Obstetrics & Gynecology

## 2015-10-04 ENCOUNTER — Encounter: Payer: Self-pay | Admitting: Emergency Medicine

## 2015-10-04 ENCOUNTER — Emergency Department
Admission: EM | Admit: 2015-10-04 | Discharge: 2015-10-04 | Disposition: A | Discharge status: Home / Self Care | Payer: Self-pay | Attending: Emergency Medicine | Admitting: Emergency Medicine

## 2015-10-04 DIAGNOSIS — N739 Female pelvic inflammatory disease, unspecified: Secondary | ICD-10-CM | POA: Insufficient documentation

## 2015-10-04 DIAGNOSIS — Z3202 Encounter for pregnancy test, result negative: Secondary | ICD-10-CM | POA: Insufficient documentation

## 2015-10-04 DIAGNOSIS — N390 Urinary tract infection, site not specified: Secondary | ICD-10-CM | POA: Insufficient documentation

## 2015-10-04 DIAGNOSIS — F17210 Nicotine dependence, cigarettes, uncomplicated: Secondary | ICD-10-CM | POA: Insufficient documentation

## 2015-10-04 DIAGNOSIS — N766 Ulceration of vulva: Secondary | ICD-10-CM | POA: Insufficient documentation

## 2015-10-04 DIAGNOSIS — N949 Unspecified condition associated with female genital organs and menstrual cycle: Secondary | ICD-10-CM

## 2015-10-04 DIAGNOSIS — Z113 Encounter for screening for infections with a predominantly sexual mode of transmission: Secondary | ICD-10-CM | POA: Insufficient documentation

## 2015-10-04 DIAGNOSIS — Z79899 Other long term (current) drug therapy: Secondary | ICD-10-CM | POA: Insufficient documentation

## 2015-10-04 DIAGNOSIS — N9489 Other specified conditions associated with female genital organs and menstrual cycle: Secondary | ICD-10-CM

## 2015-10-04 DIAGNOSIS — N730 Acute parametritis and pelvic cellulitis: Secondary | ICD-10-CM

## 2015-10-04 LAB — URINALYSIS COMPLETE WITH MICROSCOPIC (ARMC ONLY)
Bilirubin Urine: NEGATIVE
Glucose, UA: NEGATIVE mg/dL
Hgb urine dipstick: NEGATIVE
KETONES UR: NEGATIVE mg/dL
Nitrite: NEGATIVE
PROTEIN: 30 mg/dL — AB
Specific Gravity, Urine: 1.02 (ref 1.005–1.030)
pH: 6 (ref 5.0–8.0)

## 2015-10-04 LAB — CHLAMYDIA/NGC RT PCR (ARMC ONLY)
CHLAMYDIA TR: NOT DETECTED
N GONORRHOEAE: NOT DETECTED

## 2015-10-04 LAB — POCT PREGNANCY, URINE: PREG TEST UR: NEGATIVE

## 2015-10-04 LAB — WET PREP, GENITAL
Clue Cells Wet Prep HPF POC: NONE SEEN
Trich, Wet Prep: NONE SEEN
Yeast Wet Prep HPF POC: NONE SEEN

## 2015-10-04 MED ORDER — ACYCLOVIR 400 MG PO TABS: 400.0000 mg | ORAL_TABLET | Freq: Three times a day (TID) | ORAL | Status: AC

## 2015-10-04 MED ORDER — LIDOCAINE VISCOUS 2 % MT SOLN: 20.0000 mL | OROMUCOSAL | Status: DC | PRN

## 2015-10-04 MED ORDER — CEFTRIAXONE SODIUM 250 MG IJ SOLR
INTRAMUSCULAR | Status: AC
  Administered 2015-10-04: 250 mg via INTRAMUSCULAR
  Filled 2015-10-04: qty 250

## 2015-10-04 MED ORDER — DOXYCYCLINE HYCLATE 100 MG PO TABS: 100.0000 mg | ORAL_TABLET | Freq: Two times a day (BID) | ORAL | Status: DC

## 2015-10-04 MED ORDER — LIDOCAINE VISCOUS 2 % MT SOLN
15.0000 mL | Freq: Once | OROMUCOSAL | Status: AC
  Administered 2015-10-04: 15 mL via OROMUCOSAL

## 2015-10-04 MED ORDER — LIDOCAINE VISCOUS 2 % MT SOLN
OROMUCOSAL | Status: AC
  Administered 2015-10-04: 15 mL via OROMUCOSAL
  Filled 2015-10-04: qty 15

## 2015-10-04 MED ORDER — LIDOCAINE HCL (PF) 1 % IJ SOLN
INTRAMUSCULAR | Status: AC
  Filled 2015-10-04: qty 5

## 2015-10-04 MED ORDER — NITROFURANTOIN MONOHYD MACRO 100 MG PO CAPS: 100.0000 mg | ORAL_CAPSULE | Freq: Two times a day (BID) | ORAL | Status: AC

## 2015-10-04 MED ORDER — CEFTRIAXONE SODIUM 250 MG IJ SOLR
250.0000 mg | Freq: Once | INTRAMUSCULAR | Status: AC
  Administered 2015-10-04: 250 mg via INTRAMUSCULAR

## 2015-10-04 NOTE — Discharge Instructions (Signed)
As discussed please speak with your primary care doctor for further testing. It is advised that you speak with all sexual partners so they can get tested as well. Please seek medical attention for any high fevers, chest pain, shortness of breath, change in behavior, persistent vomiting, bloody stool or any other new or concerning symptoms.   Pelvic Inflammatory Disease Pelvic inflammatory disease (PID) refers to an infection in some or all of the female organs. The infection can be in the uterus, ovaries, fallopian tubes, or the surrounding tissues in the pelvis. PID can cause abdominal or pelvic pain that comes on suddenly (acute pelvic pain). PID is a serious infection because it can lead to lasting (chronic) pelvic pain or the inability to have children (infertility). CAUSES This condition is most often caused by an infection that is spread during sexual contact. However, the infection can also be caused by the normal bacteria that are found in the vaginal tissues if these bacteria travel upward into the reproductive organs. PID can also occur following:  The birth of a baby.  A miscarriage.  An abortion.  Major pelvic surgery.  The use of an intrauterine device (IUD).  A sexual assault. RISK FACTORS This condition is more likely to develop in women who:  Are younger than 20 years of age.  [facility].  Use nonbarrier contraception.  Have multiple sexual partners.  Have sex with someone who has symptoms of an STD (sexually transmitted disease).  Use oral contraception. At times, certain behaviors can also increase the possibility of getting PID, such as:  Using a vaginal douche.  Having an IUD in place. SYMPTOMS Symptoms of this condition include:  Abdominal or pelvic pain.  Fever.  Chills.  Abnormal vaginal discharge.  Abnormal uterine bleeding.  Unusual pain shortly after the end of a menstrual period.  Painful urination.  Pain with  sexual intercourse.  Nausea and vomiting. DIAGNOSIS To diagnose this condition, your health care provider will do a physical exam and take your medical history. A pelvic exam typically reveals great tenderness in the uterus and the surrounding pelvic tissues. You may also have tests, such as:  Lab tests, including a pregnancy test, blood tests, and urine test.  Culture tests of the vagina and cervix to check for an STD.  Ultrasound.  A laparoscopic procedure to look inside the pelvis.  Examining vaginal secretions under a microscope. TREATMENT Treatment for this condition may involve one or more approaches.  Antibiotic medicines may be prescribed to be taken by mouth.  Sexual partners may need to be treated if the infection is caused by an STD.  For more severe cases, hospitalization may be needed to give antibiotics directly into a vein through an IV tube.  Surgery may be needed if other treatments do not help, but this is rare. It may take weeks until you are completely well. If you are diagnosed with PID, you should also be checked for human immunodeficiency virus (HIV). Your health care provider may test you for infection again 3 months after treatment. You should not have unprotected sex. HOME CARE INSTRUCTIONS  Take over-the-counter and prescription medicines only as told by your health care provider.  If you were prescribed an antibiotic medicine, take it as told by your health care provider. Do not stop taking the antibiotic even if you start to feel better.  Do not have sexual intercourse until treatment is completed or as told by your health care provider. If PID  is confirmed, your recent sexual partners will need treatment, especially if you had unprotected sex.  Keep all follow-up visits as told by your health care provider. This is important. SEEK MEDICAL CARE IF:  You have increased or abnormal vaginal discharge.  Your pain does not improve.  You vomit.  You  have a fever.  You cannot tolerate your medicines.  Your partner has an STD.  You have pain when you urinate. SEEK IMMEDIATE MEDICAL CARE IF:  You have increased abdominal or pelvic pain.  You have chills.  Your symptoms are not better in 72 hours even with treatment.   This information is not intended to replace advice given to you by your health care provider. Make sure you discuss any questions you have with your health care provider.   Document Released: 11/06/2005 Document Revised: 07/28/2015 Document Reviewed: 12/14/2014 Elsevier Interactive Patient Education 2016 Elsevier Inc.  Urinary Tract Infection A urinary tract infection (UTI) can occur any place along the urinary tract. The tract includes the kidneys, ureters, bladder, and urethra. A type of germ called bacteria often causes a UTI. UTIs are often helped with antibiotic medicine.  HOME CARE   If given, take antibiotics as told by your doctor. Finish them even if you start to feel better.  Drink enough fluids to keep your pee (urine) clear or pale yellow.  Avoid tea, drinks with caffeine, and bubbly (carbonated) drinks.  Pee often. Avoid holding your pee in for a long time.  Pee before and after having sex (intercourse).  Wipe from front to back after you poop (bowel movement) if you are a woman. Use each tissue only once. GET HELP RIGHT AWAY IF:   You have back pain.  You have lower belly (abdominal) pain.  You have chills.  You feel sick to your stomach (nauseous).  You throw up (vomit).  Your burning or discomfort with peeing does not go away.  You have a fever.  Your symptoms are not better in 3 days. MAKE SURE YOU:   Understand these instructions.  Will watch your condition.  Will get help right away if you are not doing well or get worse.   This information is not intended to replace advice given to you by your health care provider. Make sure you discuss any questions you have with your  health care provider.   Document Released: 04/24/2008 Document Revised: 11/27/2014 Document Reviewed: 06/06/2012 Elsevier Interactive Patient Education 2016 Elsevier Inc. Genital Herpes Genital herpes is a common sexually transmitted infection (STI) that is caused by a virus. The virus is spread from person to person through sexual contact. Infection can cause itching, blisters, and sores in the genital area or rectal area. This is called an outbreak. It affects both men and women. Genital herpes is particularly concerning for pregnant women because the virus can be passed to the baby during delivery and cause serious problems. Genital herpes is also a concern for people with a weakened defense (immune) system. Symptoms of genital herpes may last several days and then go away. However, the virus remains in your body, so you may have more outbreaks of symptoms in the future. The time between outbreaks varies and can be months or years. CAUSES Genital herpes is caused by a virus called herpes simplex virus (HSV) type 2 or HSV type 1. These viruses are contagious and are most often spread through sexual contact with an infected person. Sexual contact includes vaginal, anal, and oral sex. RISK FACTORS Risk factors  for genital herpes include:  Being sexually active with multiple partners.  Having unprotected sex. SIGNS AND SYMPTOMS Symptoms may include:  Pain and itching in the genital area or rectal area.  Small red bumps that turn into blisters and then turn into sores.  Flu-like symptoms, including:  Fever.  Body aches.  Painful urination.  Vaginal discharge. DIAGNOSIS Genital herpes may be diagnosed by:  Physical exam.  Blood test.  Fluid culture test from an open sore. TREATMENT There is no cure for genital herpes. Oral antiviral medicines may be used to speed up healing and to help prevent the return of symptoms. These medicines can also help to reduce the spread of the  virus to sexual partners. HOME CARE INSTRUCTIONS  Keep the affected areas dry and clean.  Take medicines only as directed by your health care provider.  Do not have sexual contact during active infections. Genital herpes is contagious.  Practice safe sex. Latex condoms and female condoms may help to prevent the spread of the herpes virus.  Avoid rubbing or touching the blisters and sores. If you do touch the blister or sores:  Wash your hands thoroughly.  Do not touch your eyes afterward.  If you become pregnant, tell your health care provider if you have had genital herpes.  Keep all follow-up visits as directed by your health care provider. This is important. PREVENTION  Use condoms. Although anyone can contract genital herpes during sexual contact even with the use of a condom, a condom can provide some protection.  Avoid having multiple sexual partners.  Talk to your sexual partner about any symptoms and past history that either of you may have.  Get tested before you have sex. Ask your partner to do the same.  Recognize the symptoms of genital herpes. Do not have sexual contact if you notice these symptoms. SEEK MEDICAL CARE IF:  Your symptoms are not improving with medicine.  Your symptoms return.  You have new symptoms.  You have a fever.  You have abdominal pain.  You have redness, swelling, or pain in your eye. MAKE SURE YOU:  Understand these instructions.  Will watch your condition.  Will get help right away if you are not doing well or get worse.   This information is not intended to replace advice given to you by your health care provider. Make sure you discuss any questions you have with your health care provider.   Document Released: 11/03/2000 Document Revised: 11/27/2014 Document Reviewed: 03/24/2014 Elsevier Interactive Patient Education [employer]
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
## 2015-10-04 NOTE — Progress Notes (Signed)
Pt states boyfriend has cheated on her, is experiencing vaginal discharge and burning, also c/o an area that is reddened with a bump and is very painful. Pt has appt with Nada MaclachlanKaren Teague-Clark on Wed 10-06-15 for evaluation.  Will send urine GC/CT.

## 2015-10-04 NOTE — Addendum Note (Signed)
Addended by: Gita KudoLASSITER, KRISTEN S on: 10/04/2015 11:48 AM   Modules accepted: Orders

## 2015-10-04 NOTE — ED Notes (Signed)
Vaginal discharge x 1 week, burning with urination

## 2015-10-04 NOTE — ED Notes (Signed)
States noticed vag discharge past several days , also has some low abd pain, has some type of swelling around outside of vagina

## 2015-10-05 LAB — URINE CYTOLOGY ANCILLARY ONLY
CHLAMYDIA, DNA PROBE: NEGATIVE
NEISSERIA GONORRHEA: NEGATIVE

## 2015-10-06 ENCOUNTER — Ambulatory Visit: Payer: Medicaid Other | Admitting: Physician Assistant

## 2015-10-07 ENCOUNTER — Telehealth: Payer: Self-pay | Admitting: *Deleted

## 2015-10-07 NOTE — Telephone Encounter (Signed)
Opened in error

## 2015-10-08 LAB — HSV CULTURE AND TYPING

## 2015-10-13 ENCOUNTER — Encounter: Payer: Self-pay | Admitting: Urgent Care

## 2015-10-13 ENCOUNTER — Emergency Department
Admission: EM | Admit: 2015-10-13 | Discharge: 2015-10-13 | Disposition: A | Discharge status: Home / Self Care | Payer: Medicaid Other | Attending: Emergency Medicine | Admitting: Emergency Medicine

## 2015-10-13 DIAGNOSIS — Z79899 Other long term (current) drug therapy: Secondary | ICD-10-CM | POA: Insufficient documentation

## 2015-10-13 DIAGNOSIS — N898 Other specified noninflammatory disorders of vagina: Secondary | ICD-10-CM | POA: Insufficient documentation

## 2015-10-13 DIAGNOSIS — L03012 Cellulitis of left finger: Secondary | ICD-10-CM | POA: Insufficient documentation

## 2015-10-13 DIAGNOSIS — IMO0001 Reserved for inherently not codable concepts without codable children: Secondary | ICD-10-CM

## 2015-10-13 DIAGNOSIS — Z792 Long term (current) use of antibiotics: Secondary | ICD-10-CM | POA: Insufficient documentation

## 2015-10-13 DIAGNOSIS — B370 Candidal stomatitis: Secondary | ICD-10-CM

## 2015-10-13 DIAGNOSIS — F17210 Nicotine dependence, cigarettes, uncomplicated: Secondary | ICD-10-CM | POA: Insufficient documentation

## 2015-10-13 MED ORDER — NYSTATIN 100000 UNIT/ML MT SUSP: 5.0000 mL | Freq: Four times a day (QID) | OROMUCOSAL | Status: DC

## 2015-10-13 MED ORDER — CEPHALEXIN 500 MG PO CAPS: 500.0000 mg | ORAL_CAPSULE | Freq: Four times a day (QID) | ORAL | Status: DC

## 2015-10-13 NOTE — ED Notes (Signed)
Patient presents with reports of possible thrush and vaginal yeast infection s/p ABX injection here for possible STD. Patient was given Rx's here, however she did not fill secondary to financial constraints. Patient also with a small area of compromised [facility]; reports that she cut finger while at work.

## 2015-10-13 NOTE — ED Provider Notes (Signed)
[facility] Emergency Department Provider Note REMINDER - THIS NOTE IS NOT A FINAL MEDICAL RECORD UNTIL IT IS SIGNED. UNTIL THEN, THE CONTENT BELOW MAY REFLECT INFORMATION FROM A DOCUMENTATION TEMPLATE, NOT THE ACTUAL PATIENT VISIT. ____________________________________________  Time seen: Approximately 2pam  I have reviewed the triage vital signs and the nursing notes.   HISTORY  Chief Complaint Thrush and Vaginal Itching    HPI Debbie [name] is a 19 y.o. female a recent diagnosis of PID, treated with antibiotics.  Patient presents today states that she has developed "thrush" after being treated with antibiotics. She reports that her symptoms of pelvic pain, discomfort, and vaginal itch and burning have improved, however she has developed "thrush" on her tongue. In addition she cut herself with a knife at work a few days ago and hasa small area of redness over her left second digit.  She denies any abdominal pain, vaginal discharge, burning or pregnancy at this time. She is not having any trouble swallowing or mouth pain. She reports that she's had thrush many times before and has been treated with nystatin swish and swallow this without problems in the past.  She reports her tetanus is up-to-date. No pain in the hand, numbness or tingling on the left side.   Past Medical History  Diagnosis Date  . No pertinent past medical history   . Urinary tract infection     Patient Active Problem List   Diagnosis Date Noted  . Contraception management 10/13/2014  . Screen for STD (sexually transmitted disease) 10/13/2014  . Menorrhagia 04/22/2013  . 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 Family history of Duchenne muscle dystrophy 08/26/2012    Past Surgical History  Procedure Laterality Date  . Appendectomy      Current Outpatient Rx  Name  Route  Sig  Dispense  Refill  . acyclovir (ZOVIRAX) 400 MG tablet   Oral   Take 1 tablet  (400 mg total) by mouth 3 (three) times daily.   30 tablet   0   . cephALEXin (KEFLEX) 500 MG capsule   Oral   Take 1 capsule (500 mg total) by mouth 4 (four) times daily.   40 capsule   0   . doxycycline (VIBRA-TABS) 100 MG tablet   Oral   Take 1 tablet (100 mg total) by mouth 2 (two) times daily.   14 tablet   0   . lidocaine (XYLOCAINE) 2 % solution   Mouth/Throat   Use as directed 20 mLs in the mouth or throat as needed (Ulcer pain).   100 mL   0   . metroNIDAZOLE (FLAGYL) 500 MG tablet   Oral   Take 1 tablet (500 mg total) by mouth 2 (two) times daily.   14 tablet   0   . norgestimate-ethinyl estradiol (ORTHO-CYCLEN,SPRINTEC,PREVIFEM) 0.25-35 MG-MCG tablet   Oral   Take 1 tablet by mouth daily.   1 Package   11   . nystatin (MYCOSTATIN) 100000 UNIT/ML suspension   Oral   Take 5 mLs (500,000 Units total) by mouth 4 (four) times daily.   60 mL   0     Allergies Nystatin  Family History  Problem Relation Age of Onset  . Clotting disorder Mother     has a filter  . Asthma Mother   . Depression Mother   . Hypertension Father   . Muscular dystrophy Cousin     Duchenne Muscular Dystrophy  . Muscular dystrophy Cousin  Social History Social History  Substance Use Topics  . Smoking status: Current Some Day Smoker    Types: Cigarettes  . Smokeless tobacco: Never Used  . Alcohol Use: No     Comment: social    Review of Systems Constitutional: No fever/chills Eyes: No visual changes. ENT: No sore throat. Cardiovascular: Denies chest pain. Respiratory: Denies shortness of breath. Gastrointestinal: No abdominal pain.  No nausea, no vomiting.  No diarrhea.  No constipation. Genitourinary: Negative for dysuria. Musculoskeletal: Negative for back pain. Skin: Negative for rash. Neurological: Negative for headaches, focal weakness or numbness.  As patient is the nurse noted reports she had a vaginal yeast infection reported as well, but patient tells  me that she had these symptoms and those have cleared up after she was treated in the ER previously, she is not currently having any vaginal or pelvic symptoms.  10-point ROS otherwise negative.  ____________________________________________   PHYSICAL EXAM:  VITAL SIGNS: ED Triage Vitals  Enc Vitals Group     BP 10/13/15 0141 113/9 mmHg     Pulse Rate 10/13/15 0141 75     Resp 10/13/15 0141 16     Temp 10/13/15 0141 98.3 F (36.8 C)     Temp Source 10/13/15 0141 Oral     SpO2 10/13/15 0141 96 %     Weight 10/13/15 0141 120 lb (54.432 kg)     Height 10/13/15 0141  (1.6 m)     Head Cir --      Peak Flow --      Pain Score 10/13/15 0155 3     Pain Loc --      Pain Edu? --      Excl. in GC? --    Constitutional: Alert and oriented. Well appearing and in no acute distress. Eyes: Conjunctivae are normal. PERRL. EOMI. Head: Atraumatic. Nose: No congestion/rhinnorhea. Mouth/Throat: Mucous membranes are moist.  Oropharynx non-erythematous. Patient has a adherent, whitish yellow plaque on the front of the tongue consistent with oral thrush. Neck: No stridor.   Cardiovascular: Normal rate, regular rhythm. Grossly normal heart sounds.  Good peripheral circulation. Respiratory: Normal respiratory effort.  No retractions. Lungs CTAB. Gastrointestinal: Soft and nontender. No distention. No abdominal bruits. No CVA tenderness. Musculoskeletal: No lower extremity tenderness nor edema.  No joint effusions. Right upper extremity normal. Left upper she may normal with exception of a very tiny approximately 1 cm area of minimal erythema over the mid phalanx of the left fourth digit. Normal sensation and capillary refill. Normal median ulnar and radial nerves. Neurologic:  Normal speech and language. No gross focal neurologic deficits are appreciated.Skin:  Skin is warm, dry and intact. No rash noted. Psychiatric: Mood and affect are normal. Speech and behavior are  normal.  ____________________________________________   LABS (all labs ordered are listed, but only abnormal results are displayed)  Labs Reviewed - No data to display ____________________________________________  EKG   ____________________________________________  RADIOLOGY   ____________________________________________   PROCEDURES  Procedure(s) performed: None  Critical Care performed: No  ____________________________________________   INITIAL IMPRESSION / ASSESSMENT AND PLAN / ED COURSE  Pertinent labs & imaging results that were available during my care of the patient were reviewed by me and considered in my medical decision making (see chart for details).  The patient reports multiple episodes of "thrush" in the past, I advised obtaining HIV testing here but the patient reported that due to time constraints she cannot but will be agreeable to follow up with  either her primary gynecologist for this testing as advised.  Patient does have oral thrush, she reports a previous problem with topical nystatin cream but reports that since then she's had oral nystatin and has no allergy to this. We'll treat with oral thrush, customary return precautions advised.  Patient has a small area of cellulitis over the left digit. No evidence of abscess. She has had a tetanus shot. No evidence of complication. No circumferential swelling of the finger, no flexor tendon pain, no tenderness with extension or flexion of the digits. We'll treat her with cephalexin, have advise close follow-up and return precautions for this as well. ____________________________________________   FINAL CLINICAL IMPRESSION(S) / ED DIAGNOSES  Final diagnoses:  Thrush, oral  Cellulitis of fourth finger, left   Please note that impression should say cellulitis of the fourth finger on the left, not second finger   Sharyn CreamerMark Quale, MD 10/13/15 [phone]

## 2015-10-13 NOTE — Discharge Instructions (Signed)
As we discussed, please follow-up with the infectious disease clinic for evaluation as I do recommend HIV testing, but you have declined be able to have this performed today in the ER due to time constraint.  Thrush, Adult Ginette Pitman, also called oral candidiasis, is a fungal infection that develops in the mouth and throat and on the tongue. It causes white patches to form on the mouth and tongue. Ginette Pitman is most common in older adults, but it can occur at any age.  Many cases of thrush are mild, but this infection can also be more serious. Ginette Pitman can be a recurring problem for people who have chronic illnesses or who take medicines that limit the body's ability to fight infection. Because these people have difficulty fighting infections, the fungus that causes thrush can spread throughout the body. This can cause life-threatening blood or organ infections. CAUSES  Ginette Pitman is usually caused by a yeast called Candida albicans. This fungus is normally present in small amounts in the mouth and on other mucous membranes. It usually causes no harm. However, when conditions are present that allow the fungus to grow uncontrolled, it invades surrounding tissues and becomes an infection. Less often, other Candida species can also lead to thrush.  RISK FACTORS Ginette Pitman is more likely to develop in the following people:  People with an impaired ability to fight infection (weakened immune system).   Older adults.   People with HIV.   People with diabetes.   People with dry mouth (xerostomia).   Pregnant women.   People with poor dental care, especially those who have false teeth.   People who use antibiotic medicines.  SIGNS AND SYMPTOMS  Ginette Pitman can be a mild infection that causes no symptoms. If symptoms develop, they may include:   A burning feeling in the mouth and throat. This can occur at the start of a thrush infection.   White patches that adhere to the mouth and tongue. The tissue around  the patches may be red, raw, and painful. If rubbed (during tooth brushing, for example), the patches and the tissue of the mouth may bleed easily.   A bad taste in the mouth or difficulty tasting foods.   Cottony feeling in the mouth.   Pain during eating and swallowing. DIAGNOSIS  Your health care provider can usually diagnose thrush by looking in your mouth and asking you questions about your health.  TREATMENT  Medicines that help prevent the growth of fungi (antifungals) are the standard treatment for thrush. These medicines are either applied directly to the affected area (topical) or swallowed (oral). The treatment will depend on the severity of the condition.  Mild Thrush Mild cases of thrush may clear up with the use of an antifungal mouth rinse or lozenges. Treatment usually lasts about 14 days.  Moderate to Severe Thrush  More severe thrush infections that have spread to the esophagus are treated with an oral antifungal medicine. A topical antifungal medicine may also be used.   For some severe infections, a treatment period longer than 14 days may be needed.   Oral antifungal medicines are almost never used during pregnancy because the fetus may be harmed. However, if a pregnant woman has a rare, severe thrush infection that has spread to her blood, oral antifungal medicines may be used. In this case, the risk of harm to the mother and fetus from the severe thrush infection may be greater than the risk posed by the use of antifungal medicines.  Persistent or Recurrent  Thrush For cases of thrush that do not go away or keep coming back, treatment may involve the following:   Treatment may be needed twice as long as the symptoms last.   Treatment will include both oral and topical antifungal medicines.   People with weakened immune systems can take an antifungal medicine on a continuous basis to prevent thrush infections.  It is important to treat conditions that make  you more likely to get thrush, such as diabetes or HIV.  HOME CARE INSTRUCTIONS   Only take over-the-counter or prescription medicine as directed by your health care provider. Talk to your health care provider about an over-the-counter medicine called gentian violet, which kills bacteria and fungi.   Eat plain, unflavored yogurt as directed by your health care provider. Check the label to make sure the yogurt contains live cultures. This yogurt can help healthy bacteria grow in the mouth that can stop the growth of the fungus that causes thrush.   Try these measures to help reduce the discomfort of thrush:   Drink cold liquids such as water or iced tea.   Try flavored ice treats or frozen juices.   Eat foods that are easy to swallow, such as gelatin, ice cream, or custard.   If the patches in your mouth are painful, try drinking from a straw.   Rinse your mouth several times a day with a warm saltwater rinse. You can make the saltwater mixture with 1 tsp (6 g) of salt in 8 fl oz (0.2 L) of warm water.   If you wear dentures, remove the dentures before going to bed, brush them vigorously, and soak them in a cleaning solution as directed by your health care provider.   Women who are breastfeeding should clean their nipples with an antifungal medicine as directed by their health care provider. Dry the nipples after breastfeeding. Applying lanolin-containing body lotion may help relieve nipple soreness.  SEEK MEDICAL CARE IF:  Your symptoms are getting worse or are not improving within 7 days of starting treatment.   You have symptoms of spreading infection, such as white patches on the skin outside of the mouth.   You are nursing and you have redness, burning, or pain in the nipples that is not relieved with treatment.  MAKE SURE YOU:  Understand these instructions.  Will watch your condition.  Will get help right away if you are not doing well or get worse.   This  information is not intended to replace advice given to you by your health care provider. Make sure you discuss any questions you have with your health care provider.   Document Released: 08/01/2004 Document Revised: 11/27/2014 Document Reviewed: 06/09/2013 Elsevier Interactive Patient Education 2016 [employer]   Cellulitis Cellulitis is an infection of the skin and the tissue beneath it. The infected area is usually red and tender. Cellulitis occurs most often in the arms and lower legs.  CAUSES  Cellulitis is caused by bacteria that enter the skin through cracks or cuts in the skin. The most common types of bacteria that cause cellulitis are staphylococci and streptococci. SIGNS AND SYMPTOMS   Redness and warmth.  Swelling.  Tenderness or pain.  Fever. DIAGNOSIS  Your health care provider can usually determine what is wrong based on a physical exam. Blood tests may also be done. TREATMENT  Treatment usually involves taking an antibiotic medicine. HOME CARE INSTRUCTIONS   Take your antibiotic medicine as directed by your health care provider. Finish the  antibiotic even if you start to feel better.  Keep the infected arm or leg elevated to reduce swelling.  Apply a warm cloth to the affected area up to 4 times per day to relieve pain.  Take medicines only as directed by your health care provider.  Keep all follow-up visits as directed by your health care provider. SEEK MEDICAL CARE IF:   You notice red streaks coming from the infected area.  Your red area gets larger or turns dark in color.  Your bone or joint underneath the infected area becomes painful after the skin has healed.  Your infection returns in the same area or another area.  You notice a swollen bump in the infected area.  You develop new symptoms.  You have a fever. SEEK IMMEDIATE MEDICAL CARE IF:   You feel very sleepy.  You develop vomiting or diarrhea.  You have a general ill feeling  (malaise) with muscle aches and pains.   This information is not intended to replace advice given to you by your health care provider. Make sure you discuss any questions you have with your health care provider.   Document Released: 08/16/2005 Document Revised: 07/28/2015 Document Reviewed: 01/22/2012 Elsevier Interactive Patient Education [employer]

## 2016-01-11 ENCOUNTER — Ambulatory Visit: Payer: Medicaid Other | Admitting: Obstetrics and Gynecology

## 2016-01-12 ENCOUNTER — Ambulatory Visit: Payer: Medicaid Other | Admitting: Obstetrics & Gynecology

## 2016-03-30 ENCOUNTER — Emergency Department
Admission: EM | Admit: 2016-03-30 | Discharge: 2016-03-30 | Disposition: A | Discharge status: Home / Self Care | Payer: Medicaid Other | Attending: Emergency Medicine | Admitting: Emergency Medicine

## 2016-03-30 DIAGNOSIS — Y939 Activity, unspecified: Secondary | ICD-10-CM | POA: Insufficient documentation

## 2016-03-30 DIAGNOSIS — F17210 Nicotine dependence, cigarettes, uncomplicated: Secondary | ICD-10-CM | POA: Insufficient documentation

## 2016-03-30 DIAGNOSIS — W57XXXA Bitten or stung by nonvenomous insect and other nonvenomous arthropods, initial encounter: Secondary | ICD-10-CM | POA: Insufficient documentation

## 2016-03-30 DIAGNOSIS — Y999 Unspecified external cause status: Secondary | ICD-10-CM | POA: Insufficient documentation

## 2016-03-30 DIAGNOSIS — Y929 Unspecified place or not applicable: Secondary | ICD-10-CM | POA: Insufficient documentation

## 2016-03-30 DIAGNOSIS — S80862A Insect bite (nonvenomous), left lower leg, initial encounter: Secondary | ICD-10-CM | POA: Insufficient documentation

## 2016-03-30 DIAGNOSIS — Z79899 Other long term (current) drug therapy: Secondary | ICD-10-CM | POA: Insufficient documentation

## 2016-03-30 MED ORDER — CEPHALEXIN 500 MG PO CAPS: 500.0000 mg | ORAL_CAPSULE | Freq: Two times a day (BID) | ORAL | Status: AC

## 2016-03-30 MED ORDER — SULFAMETHOXAZOLE-TRIMETHOPRIM 800-160 MG PO TABS
1.0000 | ORAL_TABLET | Freq: Once | ORAL | Status: AC
  Administered 2016-03-30: 1 via ORAL
  Filled 2016-03-30: qty 1

## 2016-03-30 MED ORDER — SULFAMETHOXAZOLE-TRIMETHOPRIM 800-160 MG PO TABS: 1.0000 | ORAL_TABLET | Freq: Two times a day (BID) | ORAL | Status: AC

## 2016-03-30 MED ORDER — CEPHALEXIN 500 MG PO CAPS
500.0000 mg | ORAL_CAPSULE | Freq: Once | ORAL | Status: AC
  Administered 2016-03-30: 500 mg via ORAL
  Filled 2016-03-30: qty 1

## 2016-03-30 NOTE — ED Provider Notes (Signed)
[facility] Emergency Department Provider Note  ____________________________________________  Time seen: 3:20 AM  I have reviewed the triage vital signs and the nursing notes.   HISTORY  Chief Complaint Wound Check      HPI Debbie [name] is a 20 y.o. female presents with red swollen tender area left lower leg 3 days. Patient unsure if she was bit by an insect but suspects that she may have been bitten by a spider. Patient denies any fever afebrile on presentation temperature 98.5. They said her current pain score is 6 out of 10     Past Medical History  Diagnosis Date  . No pertinent past medical history   . Urinary tract infection     Patient Active Problem List   Diagnosis Date Noted  . Contraception management 10/13/2014  . Screen for STD (sexually transmitted disease) 10/13/2014  . Menorrhagia 04/22/2013  . 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 Family history of Duchenne muscle dystrophy 08/26/2012    Past Surgical History  Procedure Laterality Date  . Appendectomy      Current Outpatient Rx  Name  Route  Sig  Dispense  Refill  . cephALEXin (KEFLEX) 500 MG capsule   Oral   Take 1 capsule (500 mg total) by mouth 4 (four) times daily.   40 capsule   0   . doxycycline (VIBRA-TABS) 100 MG tablet   Oral   Take 1 tablet (100 mg total) by mouth 2 (two) times daily.   14 tablet   0   . lidocaine (XYLOCAINE) 2 % solution   Mouth/Throat   Use as directed 20 mLs in the mouth or throat as needed (Ulcer pain).   100 mL   0   . metroNIDAZOLE (FLAGYL) 500 MG tablet   Oral   Take 1 tablet (500 mg total) by mouth 2 (two) times daily.   14 tablet   0   . norgestimate-ethinyl estradiol (ORTHO-CYCLEN,SPRINTEC,PREVIFEM) 0.25-35 MG-MCG tablet   Oral   Take 1 tablet by mouth daily.   1 Package   11   . nystatin (MYCOSTATIN) 100000 UNIT/ML suspension   Oral   Take 5 mLs (500,000 Units total) by mouth 4 (four)  times daily.   60 mL   0     Allergies Nystatin  Family History  Problem Relation Age of Onset  . Clotting disorder Mother     has a filter  . Asthma Mother   . Depression Mother   . Hypertension Father   . Muscular dystrophy Cousin     Duchenne Muscular Dystrophy  . Muscular dystrophy Cousin     Social History Social History  Substance Use Topics  . Smoking status: Current Some Day Smoker    Types: Cigarettes  . Smokeless tobacco: Never Used  . Alcohol Use: No     Comment: social    Review of Systems  Constitutional: Negative for fever. Eyes: Negative for visual changes. ENT: Negative for sore throat. Cardiovascular: Negative for chest pain. Respiratory: Negative for shortness of breath. Gastrointestinal: Negative for abdominal pain, vomiting and diarrhea. Genitourinary: Negative for dysuria. Musculoskeletal: Negative for back pain. Skin: Positive for left leg pain swelling redness Neurological: Negative for headaches, focal weakness or numbness.   10-point ROS otherwise negative.  ____________________________________________   PHYSICAL EXAM:  VITAL SIGNS: ED Triage Vitals  Enc Vitals Group     BP 03/30/16 0143 118/94 mmHg     Pulse Rate 03/30/16 0143 88  Resp 03/30/16 0300 17     Temp 03/30/16 0143 98.5 F (36.9 C)     Temp Source 03/30/16 0143 Oral     SpO2 03/30/16 0143 100 %     Weight 03/30/16 0143 115 lb (52.164 kg)     Height 03/30/16 0143 5' 2.5" (1.588 m)     Head Cir --      Peak Flow --      Pain Score 03/30/16 0146 6     Pain Loc --      Pain Edu? --      Excl. in GC? --      Constitutional: Alert and oriented. Well appearing and in no distress. Eyes: Conjunctivae are normal. PERRL. Normal extraocular movements. ENT   Head: Normocephalic and atraumatic.   Nose: No congestion/rhinnorhea.   Mouth/Throat: Mucous membranes are moist.   Neck: No stridor. Hematological/Lymphatic/Immunilogical: No cervical  lymphadenopathy. Cardiovascular: Normal rate, regular rhythm. Normal and symmetric distal pulses are present in all extremities. No murmurs, rubs, or gallops. Respiratory: Normal respiratory effort without tachypnea nor retractions. Breath sounds are clear and equal bilaterally. No wheezes/rales/rhonchi. Gastrointestinal: Soft and nontender. No distention. There is no CVA tenderness. Genitourinary: deferred Musculoskeletal: Nontender with normal range of motion in all extremities. No joint effusions.  No lower extremity tenderness nor edema. Neurologic:  Normal speech and language. No gross focal neurologic deficits are appreciated. Speech is normal.  Skin: 3 x 3 ovoid area of erythema with central blistering noted left lower extremity Psychiatric: Mood and affect are normal. Speech and behavior are normal. Patient exhibits appropriate insight and judgment.      INITIAL IMPRESSION / ASSESSMENT AND PLAN / ED COURSE  Pertinent labs & imaging results that were available during my care of the patient were reviewed by me and considered in my medical decision making (see chart for details).  Patient received Bactrim and Keflex in the emergency department will be prescribed same for home. Patient is advised to return to emergency department in 24 hours for follow-up on exam.  ____________________________________________   FINAL CLINICAL IMPRESSION(S) / ED DIAGNOSES  Final diagnoses:  Infected insect bite      Darci Current, MD 03/30/16 [phone]

## 2016-03-30 NOTE — ED Notes (Signed)
Pt with red swollen are to left lower leg states unsure of insect bite denies any itching.

## 2016-03-30 NOTE — Discharge Instructions (Signed)

## 2016-05-23 ENCOUNTER — Encounter: Payer: Self-pay | Admitting: Emergency Medicine

## 2016-05-23 ENCOUNTER — Emergency Department: Payer: Medicaid Other

## 2016-05-23 ENCOUNTER — Emergency Department
Admission: EM | Admit: 2016-05-23 | Discharge: 2016-05-23 | Disposition: A | Discharge status: Home / Self Care | Payer: Medicaid Other | Attending: Emergency Medicine | Admitting: Emergency Medicine

## 2016-05-23 DIAGNOSIS — F17210 Nicotine dependence, cigarettes, uncomplicated: Secondary | ICD-10-CM | POA: Insufficient documentation

## 2016-05-23 DIAGNOSIS — O219 Vomiting of pregnancy, unspecified: Secondary | ICD-10-CM | POA: Insufficient documentation

## 2016-05-23 DIAGNOSIS — R52 Pain, unspecified: Secondary | ICD-10-CM

## 2016-05-23 DIAGNOSIS — Z79899 Other long term (current) drug therapy: Secondary | ICD-10-CM | POA: Insufficient documentation

## 2016-05-23 DIAGNOSIS — Z3A01 Less than 8 weeks gestation of pregnancy: Secondary | ICD-10-CM | POA: Insufficient documentation

## 2016-05-23 DIAGNOSIS — R102 Pelvic and perineal pain: Secondary | ICD-10-CM | POA: Insufficient documentation

## 2016-05-23 DIAGNOSIS — O99331 Smoking (tobacco) complicating pregnancy, first trimester: Secondary | ICD-10-CM | POA: Insufficient documentation

## 2016-05-23 DIAGNOSIS — R1011 Right upper quadrant pain: Secondary | ICD-10-CM

## 2016-05-23 LAB — URINALYSIS COMPLETE WITH MICROSCOPIC (ARMC ONLY)
BILIRUBIN URINE: NEGATIVE
Glucose, UA: NEGATIVE mg/dL
Hgb urine dipstick: NEGATIVE
KETONES UR: NEGATIVE mg/dL
Leukocytes, UA: NEGATIVE
NITRITE: NEGATIVE
PROTEIN: NEGATIVE mg/dL
SPECIFIC GRAVITY, URINE: 1.024 (ref 1.005–1.030)
pH: 7 (ref 5.0–8.0)

## 2016-05-23 LAB — HCG, QUANTITATIVE, PREGNANCY: hCG, Beta Chain, Quant, S: 52056 m[IU]/mL — ABNORMAL HIGH (ref <5)

## 2016-05-23 LAB — POCT PREGNANCY, URINE: Preg Test, Ur: POSITIVE — AB

## 2016-05-23 MED ORDER — PROMETHAZINE HCL 12.5 MG PO TABS: 12.5000 mg | ORAL_TABLET | Freq: Three times a day (TID) | ORAL | Status: DC | PRN

## 2016-05-23 NOTE — ED Notes (Signed)
States she just found out she is pregnant.  Nausea x 1 wk.

## 2016-05-23 NOTE — ED Provider Notes (Signed)
[facility] Emergency Department Provider Note  ____________________________________________  Time seen: Approximately 10:02 AM  I have reviewed the triage vital signs and the nursing notes.   HISTORY  Chief Complaint Morning Sickness    HPI Debbie [name] is a 20 y.o. female, NAD, G3P2, presents to the emergency department with 1 week history of abdominal pain, nausea and vomiting. States she had a positive home pregnancy test a few weeks ago. Has an appointment with [facility] on June 07, 2016. Patient states that this week she has been feeling increased nausea with vomiting throughout the day. Typically, she vomits in the morning, around mealtimes, and in the evening. Has a few hours in the evening when she has no illness. Denies hematemesis nor coffee ground emesis. When not feeling nauseous, is able to keep down food and liquids. Admits to slight headache, increased fatigue, and dizziness. Abdominal pain is in the RUQ, epigastric area and lower abdomin. Can be alleviated with BM and feels she may be slightly constipated. She typically has 1-2 BMs per day, but had a three day stretch without a BM. Did have BM yesterday that was hard but denies melena nor hematochezia. No fevers, chills, or night sweats. Denies vaginal pain, discharge nor bleeding. Denies any similar symptoms with her 2 previous pregnancies.    Past Medical History  Diagnosis Date  . No pertinent past medical history   . Urinary tract infection     Patient Active Problem List   Diagnosis Date Noted  . Contraception management 10/13/2014  . Screen for STD (sexually transmitted disease) 10/13/2014  . Menorrhagia 04/22/2013  . 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 Family history of Duchenne muscle dystrophy 08/26/2012    Past Surgical History  Procedure Laterality Date  . Appendectomy      Current Outpatient Rx  Name  Route  Sig  Dispense  Refill  .  norgestimate-ethinyl estradiol (ORTHO-CYCLEN,SPRINTEC,PREVIFEM) 0.25-35 MG-MCG tablet   Oral   Take 1 tablet by mouth daily.   1 Package   11   . promethazine (PHENERGAN) 12.5 MG tablet   Oral   Take 1 tablet (12.5 mg total) by mouth every 8 (eight) hours as needed for nausea or vomiting.   10 tablet   0     Allergies Nystatin  Family History  Problem Relation Age of Onset  . Clotting disorder Mother     has a filter  . Asthma Mother   . Depression Mother   . Hypertension Father   . Muscular dystrophy Cousin     Duchenne Muscular Dystrophy  . Muscular dystrophy Cousin     Social History Social History  Substance Use Topics  . Smoking status: Current Some Day Smoker    Types: Cigarettes  . Smokeless tobacco: Never Used  . Alcohol Use: No     Comment: social     Review of Systems  Constitutional: Positive fatigue. No fever/chills or night sweats.  Eyes: No visual changes.  ENT: No sore throat  Cardiovascular: No chest pain or palpitations. Respiratory: No cough, shortness of breath, dyspnea, or wheezing  Gastrointestinal: Positive for abdominal pain, nausea, vomiting, and constipation. Negative for diarrhea and GERD Genitourinary: Negative for dysuria, hematuria, vaginal discharge nor bleeding. No urinary hesitancy, urgency or increased frequency. Musculoskeletal: Negative for back pain or myalgias Skin: Negative for rash. Neurological: Positive headaches and dizziness. Negative for focal weakness, numbness, tingling. No LOC.  10-point ROS otherwise negative.  ____________________________________________  PHYSICAL EXAM:  VITAL SIGNS: ED Triage Vitals  Enc Vitals Group     BP 05/23/16 0939 122/66 mmHg     Pulse Rate 05/23/16 0939 65     Resp 05/23/16 0939 16     Temp 05/23/16 0939 98.5 F (36.9 C)     Temp Source 05/23/16 0939 Oral     SpO2 05/23/16 0939 100 %     Weight 05/23/16 0939 115 lb (52.164 kg)     Height 05/23/16 0939 5\' 3"  (1.6 m)      Head Cir --      Peak Flow --      Pain Score --      Pain Loc --      Pain Edu? --      Excl. in GC? --      Constitutional: Alert and oriented. Well appearing and in no acute distress. Eyes: Conjunctivae are normal. PERRL. EOMI without pain.  Head: Atraumatic, normocephalic ENT:      Nose: No congestion/rhinnorhea.      Mouth/Throat: Mucous membranes are moist.  Neck: No stridor. Supple with FROM and midline trachea Hematological/Lymphatic/Immunilogical: No cervical or supraclavicular lymphadenopathy. Cardiovascular: Normal rate, regular rhythm. Normal S1 and S2. No murmurs, rubs, gallops.  Good peripheral circulation with 2+ pulses in bilateral upper and lower extremities.  Respiratory: Normal respiratory effort without tachypnea or retractions. Lungs CTAB with breath sounds noted in all lung fields. No wheezes, rales, or rhonchi Gastrointestinal: Tenderness to deep palpation of the right upper quadrant, epigastric region and suprapubic region. These areas are soft without distention or guarding. All other quadrants are soft without distention or guarding. Bowel sounds grossly intact in all quadrants. No CVA tenderness. Musculoskeletal: No lower extremity tenderness or edema.  No joint effusions. Moves all four extremities without difficulty or pain.  Neurologic:  Normal speech and language. No gross focal neurologic deficits are appreciated.  Skin:  Skin is warm, dry and intact. Normal turgor. No rash, redness, skin sores.  Psychiatric: Mood and affect are normal. Speech and behavior are normal. Patient exhibits appropriate insight and judgement.   ____________________________________________   LABS (all labs ordered are listed, but only abnormal results are displayed)  Labs Reviewed  URINALYSIS COMPLETEWITH MICROSCOPIC ([facility] ONLY) - Abnormal; Notable for the following:    Color, Urine YELLOW (*)    APPearance CLEAR (*)    Bacteria, UA FEW (*)    Squamous Epithelial / LPF 0-5  (*)    All other components within normal limits  HCG, QUANTITATIVE, PREGNANCY - Abnormal; Notable for the following:    hCG, Beta Chain, Quant, S 52056 (*)    All other components within normal limits  POCT PREGNANCY, URINE - Abnormal; Notable for the following:    Preg Test, Ur POSITIVE (*)    All other components within normal limits   ____________________________________________  EKG  None ____________________________________________  RADIOLOGY I have personally viewed and evaluated these images (plain radiographs) as part of my medical decision making, as well as reviewing the written report by the radiologist.  US Ob Comp Less 14 Wks  05/23/2016  CLINICAL DATA:  Pain in pregnancy. EXAM: OBSTETRIC <14 WK [place] AND TRANSVAGINAL OB US TECHNIQUE: Both transabdominal and transvaginal ultrasound examinations were performed for complete evaluation of the gestation as well as the maternal uterus, adnexal regions, and pelvic cul-de-sac. Transvaginal technique was performed to assess early pregnancy. COMPARISON:  None. FINDINGS: Intrauterine gestational sac: Single Yolk sac:  Yes Embryo:  Yes Cardiac Activity:  Yes Heart Rate: 169  bpm CRL:  7  mm   6 w   4 d                  US EDC: 01/12/2017 Subchorionic hemorrhage:  None Maternal uterus/adnexae: Right ovary: Normal Left ovary: Normal Other :None Free fluid:  None IMPRESSION: 1. Single living intrauterine gestation has an estimated gestational age of [redacted] weeks and 4 days. 2. No complications identified. Electronically Signed   By: Signa Kellaylor  Stroud M.D.   On: 05/23/2016 11:58   [place] Ob Transvaginal  05/23/2016  CLINICAL DATA:  Pain in pregnancy. EXAM: OBSTETRIC <14 WK US AND TRANSVAGINAL OB US TECHNIQUE: Both transabdominal and transvaginal ultrasound examinations were performed for complete evaluation of the gestation as well as the maternal uterus, adnexal regions, and pelvic cul-de-sac. Transvaginal technique was performed to assess early pregnancy.  COMPARISON:  None. FINDINGS: Intrauterine gestational sac: Single Yolk sac:  Yes Embryo:  Yes Cardiac Activity: Yes Heart Rate: 169  bpm CRL:  7  mm   6 w   4 d                  US EDC: 01/12/2017 Subchorionic hemorrhage:  None Maternal uterus/adnexae: Right ovary: Normal Left ovary: Normal Other :None Free fluid:  None IMPRESSION: 1. Single living intrauterine gestation has an estimated gestational age of [redacted] weeks and 4 days. 2. No complications identified. Electronically Signed   By: Signa Kellaylor  Stroud M.D.   On: 05/23/2016 11:58   [place] Abdomen Limited Ruq  05/23/2016  CLINICAL DATA:  Abdominal pain, nausea, vomiting for 1 week EXAM: US ABDOMEN LIMITED - RIGHT UPPER QUADRANT COMPARISON:  None. FINDINGS: Gallbladder: No gallstones or wall thickening visualized. No sonographic Murphy sign noted by sonographer. Common bile duct: Diameter: Normal caliber, 2 mm Liver: No focal lesion identified. Within normal limits in parenchymal echogenicity. IMPRESSION: Unremarkable right upper quadrant ultrasound. Electronically Signed   By: Charlett NoseKevin  Dover M.D.   On: 05/23/2016 11:59    ____________________________________________    PROCEDURES  Procedure(s) performed: None     Medications - No data to display   ____________________________________________   INITIAL IMPRESSION / ASSESSMENT AND PLAN / ED COURSE  Pertinent labs & imaging results that were available during my care of the patient were reviewed by me and considered in my medical decision making (see chart for details).  Patient's diagnosis is consistent with nausea and vomiting during pregnancy, tobacco use during pregnancy. Patient will be discharged home with prescriptions for promethazine to use sparingly as needed for severe nausea and vomiting. Patient advised to follow the BRAT diet and increase consumption of water and limit consumption of sugary and caffeinated beverages. Avoid triggers that cause nausea or vomiting. Patient is to follow up  with her OB/GYN at [facility] if symptoms persist past this treatment course. Patient is given ED precautions to return to the ED for any worsening or new symptoms.      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

## 2016-05-23 NOTE — Discharge Instructions (Signed)
Morning Sickness Morning sickness is when you feel sick to your stomach (nauseous) during pregnancy. This nauseous feeling may or may not come with vomiting. It often occurs in the morning but can be a problem any time of day. Morning sickness is most common during the first trimester, but it may continue throughout pregnancy. While morning sickness is unpleasant, it is usually harmless unless you develop severe and continual vomiting (hyperemesis gravidarum). This condition requires more intense treatment.  CAUSES  The cause of morning sickness is not completely known but seems to be related to normal hormonal changes that occur in pregnancy. RISK FACTORS You are at greater risk if you:  Experienced nausea or vomiting before your pregnancy.  Had morning sickness during a previous pregnancy.  Are pregnant with more than one baby, such as twins. TREATMENT  Do not use any medicines (prescription, over-the-counter, or herbal) for morning sickness without first talking to your health care provider. Your health care provider may prescribe or recommend:  Vitamin B6 supplements.  Anti-nausea medicines.  The herbal medicine ginger. HOME CARE INSTRUCTIONS   Only take over-the-counter or prescription medicines as directed by your health care provider.  Taking multivitamins before getting pregnant can prevent or decrease the severity of morning sickness in most women.  Eat a piece of dry toast or unsalted crackers before getting out of bed in the morning.  Eat five or six small meals a day.  Eat dry and bland foods (rice, baked potato). Foods high in carbohydrates are often helpful.  Do not drink liquids with your meals. Drink liquids between meals.  Avoid greasy, fatty, and spicy foods.  Get someone to cook for you if the smell of any food causes nausea and vomiting.  If you feel nauseous after taking prenatal vitamins, take the vitamins at night or with a snack.  Snack on protein  foods (nuts, yogurt, cheese) between meals if you are hungry.  Eat unsweetened gelatins for desserts.  Wearing an acupressure wristband (worn for sea sickness) may be helpful.  Acupuncture may be helpful.  Do not smoke.  Get a humidifier to keep the air in your house free of odors.  Get plenty of fresh air. SEEK MEDICAL CARE IF:   Your home remedies are not working, and you need medicine.  You feel dizzy or lightheaded.  You are losing weight. SEEK IMMEDIATE MEDICAL CARE IF:   You have persistent and uncontrolled nausea and vomiting.  You pass out (faint). MAKE SURE YOU:  Understand these instructions.  Will watch your condition.  Will get help right away if you are not doing well or get worse.   This information is not intended to replace advice given to you by your health care provider. Make sure you discuss any questions you have with your health care provider.   Document Released: 12/28/2006 Document Revised: 11/11/2013 Document Reviewed: 04/23/2013 Elsevier Interactive Patient Education 2016 Elsevier Inc.   Abdominal Pain During Pregnancy  Abdominal pain is common in pregnancy. Most of the time, it does not cause harm. There are many causes of abdominal pain. Some causes are more serious than others. Some of the causes of abdominal pain in pregnancy are easily diagnosed. Occasionally, the diagnosis takes time to understand. Other times, the cause is not determined. Abdominal pain can be a sign that something is very wrong with the pregnancy, or the pain may have nothing to do with the pregnancy at all. For this reason, always tell your health care provider if you  have any abdominal discomfort.  HOME CARE INSTRUCTIONS  Monitor your abdominal pain for any changes. The following actions may help to alleviate any discomfort you are experiencing:  Do not have sexual intercourse or put anything in your vagina until your symptoms go away completely.  Get plenty of rest  until your pain improves.  Drink clear fluids if you feel nauseous. Avoid solid food as long as you are uncomfortable or nauseous.  Only take over-the-counter or prescription medicine as directed by your health care provider.  Keep all follow-up appointments with your health care provider. SEEK IMMEDIATE MEDICAL CARE IF:  You are bleeding, leaking fluid, or passing tissue from the vagina.  You have increasing pain or cramping.  You have persistent vomiting.  You have painful or bloody urination.  You have a fever.  You notice a decrease in your baby's movements.  You have extreme weakness or feel faint.  You have shortness of breath, with or without abdominal pain.  You develop a severe headache with abdominal pain.  You have abnormal vaginal discharge with abdominal pain.  You have persistent diarrhea.  You have abdominal pain that continues even after rest, or gets worse. MAKE SURE YOU:  Understand these instructions.  Will watch your condition.  Will get help right away if you are not doing well or get worse. This information is not intended to replace advice given to you by your health care provider. Make sure you discuss any questions you have with your health care provider.  Document Released: 11/06/2005 Document Revised: 08/27/2013 Document Reviewed: 06/05/2013  Elsevier Interactive Patient Education [employer]    First Trimester of Pregnancy    The first trimester of pregnancy is from week 1 until the end of week 12 (months 1 through 3). During this time, your baby will begin to develop inside you. At 6-8 weeks, the eyes and face are formed, and the heartbeat can be seen on ultrasound. At the end of 12 weeks, all the baby's organs are formed. Prenatal care is all the medical care you receive before the birth of your baby. Make sure you get good prenatal care and follow all of your doctor's instructions.  HOME CARE  Medicines  Take medicine only as told by your doctor.  Some medicines are safe and some are not during pregnancy.  Take your prenatal vitamins as told by your doctor.  Take medicine that helps you poop (stool softener) as needed if your doctor says it is okay. Diet  Eat regular, healthy meals.  Your doctor will tell you the amount of weight gain that is right for you.  Avoid raw meat and uncooked cheese.  If you feel sick to your stomach (nauseous) or throw up (vomit):  Eat 4 or 5 small meals a day instead of 3 large meals.  Try eating a few soda crackers.  Drink liquids between meals instead of during meals. If you have a hard time pooping (constipation):  Eat high-fiber foods like fresh vegetables, fruit, and whole grains.  Drink enough fluids to keep your pee (urine) clear or pale yellow. Activity and Exercise  Exercise only as told by your doctor. Stop exercising if you have cramps or pain in your lower belly (abdomen) or low back.  Try to avoid standing for long periods of time. Move your legs often if you must stand in one place for a long time.  Avoid heavy lifting.  Wear low-heeled shoes. Sit and stand up straight.  You  can have sex unless your doctor tells you not to. Relief of Pain or Discomfort  Wear a good support bra if your breasts are sore.  Take warm water baths (sitz baths) to soothe pain or discomfort caused by hemorrhoids. Use hemorrhoid cream if your doctor says it is okay.  Rest with your legs raised if you have leg cramps or low back pain.  Wear support hose if you have puffy, bulging veins (varicose veins) in your legs. Raise (elevate) your feet for 15 minutes, 3-4 times a day. Limit salt in your diet. Prenatal Care  Schedule your prenatal visits by the twelfth week of pregnancy.  Write down your questions. Take them to your prenatal visits.  Keep all your prenatal visits as told by your doctor. Safety  Wear your seat belt at all times when driving.  Make a list of emergency phone numbers. The list should include  numbers for family, friends, the hospital, and police and fire departments. General Tips  Ask your doctor for a referral to a local prenatal class. Begin classes no later than at the start of month 6 of your pregnancy.  Ask for help if you need counseling or help with nutrition. Your doctor can give you advice or tell you where to go for help.  Do not use hot tubs, steam rooms, or saunas.  Do not douche or use tampons or scented sanitary pads.  Do not cross your legs for long periods of time.  Avoid litter boxes and soil used by cats.  Avoid all smoking, herbs, and alcohol. Avoid drugs not approved by your doctor.  Do not use any tobacco products, including cigarettes, chewing tobacco, and electronic cigarettes. If you need help quitting, ask your doctor. You may get counseling or other support to help you quit.  Visit your dentist. At home, brush your teeth with a soft toothbrush. Be gentle when you floss. GET HELP IF:  You are dizzy.  You have mild cramps or pressure in your lower belly.  You have a nagging pain in your belly area.  You continue to feel sick to your stomach, throw up, or have watery poop (diarrhea).  You have a bad smelling fluid coming from your vagina.  You have pain with peeing (urination).  You have increased puffiness (swelling) in your face, hands, legs, or ankles. GET HELP RIGHT AWAY IF:  You have a fever.  You are leaking fluid from your vagina.  You have spotting or bleeding from your vagina.  You have very bad belly cramping or pain.  You gain or lose weight rapidly.  You throw up blood. It may look like coffee grounds.  You are around people who have [place] measles, fifth disease, or chickenpox.  You have a very bad headache.  You have shortness of breath.  You have any kind of trauma, such as from a fall or a car accident. This information is not intended to replace advice given to you by your health care provider. Make sure you discuss any questions you  have with your health care provider.  Document Released: 04/24/2008 Document Revised: 11/27/2014 Document Reviewed: 09/16/2013  Elsevier Interactive Patient Education 2016 [employer]    Steps to Quit Smoking    Smoking tobacco can be harmful to your health and can affect almost every organ in your body. Smoking puts you, and those around you, at risk for developing many serious chronic diseases. Quitting smoking is difficult, but it is one of the best things  that you can do for your health. It is never too late to quit.  WHAT ARE THE BENEFITS OF QUITTING SMOKING?  When you quit smoking, you lower your risk of developing serious diseases and conditions, such as:  Lung cancer or lung disease, such as COPD.  Heart disease.  Stroke.  Heart attack.  Infertility.  Osteoporosis and bone fractures. Additionally, symptoms such as coughing, wheezing, and shortness of breath may get better when you quit. You may also find that you get sick less often because your body is stronger at fighting off colds and infections. If you are pregnant, quitting smoking can help to reduce your chances of having a baby of low birth weight.  HOW DO I GET READY TO QUIT?  When you decide to quit smoking, create a plan to make sure that you are successful. Before you quit:  Pick a date to quit. Set a date within the next two weeks to give you time to prepare.  Write down the reasons why you are quitting. Keep this list in places where you will see it often, such as on your bathroom mirror or in your car or wallet.  Identify the people, places, things, and activities that make you want to smoke (triggers) and avoid them. Make sure to take these actions:  Throw away all cigarettes at home, at work, and in your car.  Throw away smoking accessories, such as Set designer.  Clean your car and make sure to empty the ashtray.  Clean your home, including curtains and carpets. Tell your family, friends, and coworkers  that you are quitting. Support from your loved ones can make quitting easier.  Talk with your health care provider about your options for quitting smoking.  Find out what treatment options are covered by your health insurance. WHAT STRATEGIES CAN I USE TO QUIT SMOKING?  Talk with your healthcare provider about different strategies to quit smoking. Some strategies include:  Quitting smoking altogether instead of gradually lessening how much you smoke over a period of time. Research shows that quitting "cold [place]" is more successful than gradually quitting.  Attending in-person counseling to help you build problem-solving skills. You are more likely to have success in quitting if you attend several counseling sessions. Even short sessions of 10 minutes can be effective.  Finding resources and support systems that can help you to quit smoking and remain smoke-free after you quit. These resources are most helpful when you use them often. They can include:  Online chats with a Veterinary surgeon.  Telephone quitlines.  Printed Materials engineer.  Support groups or group counseling.  Text messaging programs.  Mobile phone applications. Taking medicines to help you quit smoking. (If you are pregnant or breastfeeding, talk with your health care provider first.) Some medicines contain nicotine and some do not. Both types of medicines help with cravings, but the medicines that include nicotine help to relieve withdrawal symptoms. Your health care provider may recommend:  Nicotine patches, gum, or lozenges.  Nicotine inhalers or sprays.  Non-nicotine medicine that is taken by mouth. Talk with your health care provider about combining strategies, such as taking medicines while you are also receiving in-person counseling. Using these two strategies together makes you more likely to succeed in quitting than if you used either strategy on its own.  If you are pregnant or breastfeeding, talk with your health care  provider about finding counseling or other support strategies to quit smoking. Do not take medicine to help you  quit smoking unless told to do so by your health care provider.  WHAT THINGS CAN I DO TO MAKE IT EASIER TO QUIT?  Quitting smoking might feel overwhelming at first, but there is a lot that you can do to make it easier. Take these important actions:  Reach out to your family and friends and ask that they support and encourage you during this time. Call telephone quitlines, reach out to support groups, or work with a counselor for support.  Ask people who smoke to avoid smoking around you.  Avoid places that trigger you to smoke, such as bars, parties, or smoke-break areas at work.  Spend time around people who do not smoke.  Lessen stress in your life, because stress can be a smoking trigger for some people. To lessen stress, try:  Exercising regularly.  Deep-breathing exercises.  Yoga.  Meditating.  Performing a body scan. This involves closing your eyes, scanning your body from head to toe, and noticing which parts of your body are particularly tense. Purposefully relax the muscles in those areas. Download or purchase mobile phone or tablet apps (applications) that can help you stick to your quit plan by providing reminders, tips, and encouragement. There are many free apps, such as QuitGuide from the [employer] developer for Disease Control and Prevention). You can find other support for quitting smoking (smoking cessation) through smokefree.gov and other websites. HOW WILL I FEEL WHEN I QUIT SMOKING?  Within the first 24 hours of quitting smoking, you may start to feel some withdrawal symptoms. These symptoms are usually most noticeable 2-3 days after quitting, but they usually do not last beyond 2-3 weeks. Changes or symptoms that you might experience include:  Mood swings.  Restlessness, anxiety, or irritation.  Difficulty concentrating.  Dizziness.  Strong cravings for sugary foods in  addition to nicotine.  Mild weight gain.  Constipation.  Nausea.  Coughing or a sore throat.  Changes in how your medicines work in your body.  A depressed mood.  Difficulty sleeping (insomnia). After the first 2-3 weeks of quitting, you may start to notice more positive results, such as:  Improved sense of smell and taste.  Decreased coughing and sore throat.  Slower heart rate.  Lower blood pressure.  Clearer skin.  The ability to breathe more easily.  Fewer sick days. Quitting smoking is very challenging for most people. Do not get discouraged if you are not successful the first time. Some people need to make many attempts to quit before they achieve long-term success. Do your best to stick to your quit plan, and talk with your health care provider if you have any questions or concerns.  This information is not intended to replace advice given to you by your health care provider. Make sure you discuss any questions you have with your health care provider.  Document Released: 10/31/2001 Document Revised: 03/23/2015 Document Reviewed: 03/23/2015  Elsevier Interactive Patient Education [employer]

## 2016-05-23 NOTE — ED Notes (Signed)
Had positive home preg test ..waiting for her first OB appt on July 12th. States he is nauseated and having some intermittent stomach pain  Denies ay urinary sx,fever or vaginal bleeding  Thinks she is constipated

## 2016-06-06 ENCOUNTER — Encounter: Payer: Medicaid Other | Admitting: Obstetrics & Gynecology

## 2016-06-13 ENCOUNTER — Encounter: Payer: Medicaid Other | Admitting: Obstetrics and Gynecology

## 2016-10-04 ENCOUNTER — Ambulatory Visit
Admission: EM | Admit: 2016-10-04 | Discharge: 2016-10-04 | Disposition: A | Discharge status: Home / Self Care | Payer: Medicaid Other | Attending: Family Medicine | Admitting: Family Medicine

## 2016-10-04 DIAGNOSIS — J0100 Acute maxillary sinusitis, unspecified: Secondary | ICD-10-CM | POA: Diagnosis not present

## 2016-10-04 MED ORDER — AMOXICILLIN 875 MG PO TABS: 875.0000 mg | ORAL_TABLET | Freq: Two times a day (BID) | ORAL | 0 refills | Status: DC

## 2016-10-04 NOTE — ED Triage Notes (Addendum)
Patient complains of cough and congestion that started 2 weeks ago. Patient states that she felt like she got better and worsened again last Friday. Patient states that nose is stopped up and is also having headaches that worsen with cough. Patient states that she has tried and failed Mucinex. Patient states that she is currently pregnant and is [redacted] weeks pregnant.

## 2016-10-04 NOTE — ED Provider Notes (Signed)
[facility]    CSN: [id number] Arrival date & time: 10/04/16  1053     History   Chief Complaint Chief Complaint  Patient presents with  . Cough    HPI Lucilla EdinRaja Quanah Anguiano is a 20 y.o. female.   The history is provided by the patient.  Cough  Associated symptoms: ear pain, fever and headaches   Associated symptoms: no wheezing   URI  Presenting symptoms: congestion, cough, ear pain, facial pain and fever   Severity:  Moderate Onset quality:  Sudden Duration:  1 week Timing:  Constant Progression:  Worsening Chronicity:  New Relieved by:  Nothing Ineffective treatments:  OTC medications Associated symptoms: headaches and sinus pain   Associated symptoms: no neck pain and no wheezing   Risk factors: sick contacts   Risk factors: not elderly, no chronic cardiac disease, no chronic kidney disease, no chronic respiratory disease, no immunosuppression, no recent illness and no recent travel     Past Medical History:  Diagnosis Date  . No pertinent past medical history   . Urinary tract infection     Patient Active Problem List   Diagnosis Date Noted  . Contraception management 10/13/2014  . Screen for STD (sexually transmitted disease) 10/13/2014  . Menorrhagia 04/22/2013  . 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 Family history of Duchenne muscle dystrophy 08/26/2012    Past Surgical History:  Procedure Laterality Date  . APPENDECTOMY      OB History    Gravida Para Term Preterm AB Living   3 2 1 1   3    SAB TAB Ectopic Multiple Live Births         1 2       Home Medications    Prior to Admission medications   Medication Sig Start Date End Date Taking? Authorizing Provider  Prenatal Vit-Fe Fumarate-FA (MULTIVITAMIN-PRENATAL) 27-0.8 MG TABS tablet Take 1 tablet by mouth daily at 12 noon.   Yes Historical Provider, MD  amoxicillin (AMOXIL) 875 MG tablet Take 1 tablet (875 mg total) by mouth 2 (two) times daily. 10/04/16    Payton Mccallumrlando Campbell Kray, MD  norgestimate-ethinyl estradiol (ORTHO-CYCLEN,SPRINTEC,PREVIFEM) 0.25-35 MG-MCG tablet Take 1 tablet by mouth daily. 08/31/15   Tereso NewcomerUgonna A Anyanwu, MD  promethazine (PHENERGAN) 12.5 MG tablet Take 1 tablet (12.5 mg total) by mouth every 8 (eight) hours as needed for nausea or vomiting. 05/23/16   Jami L Hagler, PA-C    Family History Family History  Problem Relation Age of Onset  . Muscular dystrophy Cousin     Duchenne Muscular Dystrophy  . Muscular dystrophy Cousin   . Clotting disorder Mother     has a filter  . Asthma Mother   . Depression Mother   . Hypertension Father     Social History Social History  Substance Use Topics  . Smoking status: Current Some Day Smoker    Packs/day: 0.50    Types: Cigarettes  . Smokeless tobacco: Never Used  . Alcohol use No     Comment: social     Allergies   Nystatin   Review of Systems Review of Systems  Constitutional: Positive for fever.  HENT: Positive for congestion, ear pain and sinus pain.   Respiratory: Positive for cough. Negative for wheezing.   Musculoskeletal: Negative for neck pain.  Neurological: Positive for headaches.     Physical Exam Triage Vital Signs ED Triage Vitals  Enc Vitals Group     BP 10/04/16 1143 110/66  Pulse Rate 10/04/16 1143 86     Resp 10/04/16 1143 16     Temp 10/04/16 1143 97.7 F (36.5 C)     Temp Source 10/04/16 1143 Oral     SpO2 10/04/16 1143 100 %     Weight 10/04/16 1141 125 lb (56.7 kg)     Height 10/04/16 1141 5\' 3"  (1.6 m)     Head Circumference --      Peak Flow --      Pain Score 10/04/16 1143 5     Pain Loc --      Pain Edu? --      Excl. in GC? --    No data found.   Updated Vital Signs BP 110/66 (BP Location: Left Arm)   Pulse 86   Temp 97.7 F (36.5 C) (Oral)   Resp 16   Ht 5\' 3"  (1.6 m)   Wt 125 lb (56.7 kg)   LMP 04/05/2016   SpO2 100%   BMI 22.14 kg/m   Visual Acuity Right Eye Distance:   Left Eye Distance:   Bilateral  Distance:    Right Eye Near:   Left Eye Near:    Bilateral Near:     Physical Exam  Constitutional: She appears well-developed and well-nourished. No distress.  HENT:  Head: Normocephalic and atraumatic.  Right Ear: External ear and ear canal normal. Tympanic membrane is erythematous and bulging.  Left Ear: Tympanic membrane, external ear and ear canal normal.  Nose: Mucosal edema and rhinorrhea present. No nose lacerations, sinus tenderness, nasal deformity, septal deviation or nasal septal hematoma. No epistaxis.  No foreign bodies. Right sinus exhibits maxillary sinus tenderness and frontal sinus tenderness. Left sinus exhibits maxillary sinus tenderness and frontal sinus tenderness.  Mouth/Throat: Uvula is midline, oropharynx is clear and moist and mucous membranes are normal. No oropharyngeal exudate.  Eyes: Conjunctivae and EOM are normal. Pupils are equal, round, and reactive to light. Right eye exhibits no discharge. Left eye exhibits no discharge. No scleral icterus.  Neck: Normal range of motion. Neck supple. No thyromegaly present.  Cardiovascular: Normal rate, regular rhythm and normal heart sounds.   Pulmonary/Chest: Effort normal and breath sounds normal. No respiratory distress. She has no wheezes. She has no rales.  Lymphadenopathy:    She has no cervical adenopathy.  Skin: She is not diaphoretic.  Nursing note and vitals reviewed.    UC Treatments / Results  Labs (all labs ordered are listed, but only abnormal results are displayed) Labs Reviewed - No data to display  EKG  EKG Interpretation None       Radiology No results found.  Procedures Procedures (including critical care time)  Medications Ordered in UC Medications - No data to display   Initial Impression / Assessment and Plan / UC Course  I have reviewed the triage vital signs and the nursing notes.  Pertinent labs & imaging results that were available during my care of the patient were  reviewed by me and considered in my medical decision making (see chart for details).  Clinical Course       Final Clinical Impressions(s) / UC Diagnoses   Final diagnoses:  Acute maxillary sinusitis, recurrence not specified    New Prescriptions Discharge Medication List as of 10/04/2016 12:18 PM    START taking these medications   Details  amoxicillin (AMOXIL) 875 MG tablet Take 1 tablet (875 mg total) by mouth 2 (two) times daily., Starting Wed 10/04/2016, Normal  1. diagnosis reviewed with patient 2. rx as per orders above; reviewed possible side effects, interactions, risks and benefits  3. Recommend supportive treatment with rest, fluids 4. Follow-up prn if symptoms worsen or don't improve   Payton Mccallumrlando Michaeljohn Biss, MD 10/04/16 1243

## 2017-02-09 ENCOUNTER — Ambulatory Visit
Admission: EM | Admit: 2017-02-09 | Discharge: 2017-02-09 | Disposition: A | Discharge status: Home / Self Care | Payer: Medicaid Other | Attending: Family Medicine | Admitting: Family Medicine

## 2017-02-09 DIAGNOSIS — W57XXXA Bitten or stung by nonvenomous insect and other nonvenomous arthropods, initial encounter: Secondary | ICD-10-CM

## 2017-02-09 DIAGNOSIS — L03114 Cellulitis of left upper limb: Secondary | ICD-10-CM | POA: Diagnosis not present

## 2017-02-09 MED ORDER — MUPIROCIN 2 % EX OINT: 1.0000 "application " | TOPICAL_OINTMENT | Freq: Three times a day (TID) | CUTANEOUS | 0 refills | Status: DC

## 2017-02-09 NOTE — ED Triage Notes (Addendum)
Pt reports 2 nights ago she was bitten by something in sleep. Was itching on left forearm Thursday morning and was scratching it a lot yesterday. Today left forearm and hand  is very painful and red, swollen

## 2017-02-09 NOTE — ED Notes (Signed)
Pt given ice pack in triage.

## 2017-02-09 NOTE — ED Provider Notes (Signed)
[facility]    CSN: [id number] Arrival date & time: 02/09/17  0931     History   Chief Complaint Chief Complaint  Patient presents with  . Insect Bite  . Extremity Pain    HPI Debbie [name] is a 21 y.o. female.   Patient is a 21 year old black female who presents with a insect bite on the left forearm. The left forearm she thinks was bitten yesterday morning by some type of insect. Her significant other placed toothpaste on the arm but that hasn't helped now this morning the whole left forearm is red and swollen and tender to palpation. She does smoke she has a history of having appendectomy before. There is a family history of Duchenne muscular dystrophy father has hypertension and mother has asthma and depression. She's had a recent child. Allergic to shellfish and nystatin.   The history is provided by the patient and a relative. No language interpreter was used.  Extremity Pain  This is a new problem. The current episode started yesterday. The problem occurs constantly. The problem has been rapidly worsening. Pertinent negatives include no chest pain, no abdominal pain, no headaches and no shortness of breath. The symptoms are aggravated by twisting (toching the arm). Nothing relieves the symptoms. Treatments tried: tooth paste.    Past Medical History:  Diagnosis Date  . No pertinent past medical history   . Urinary tract infection     Patient Active Problem List   Diagnosis Date Noted  . Contraception management 10/13/2014  . Screen for STD (sexually transmitted disease) 10/13/2014  . Menorrhagia 04/22/2013  . 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 Family history of Duchenne muscle dystrophy 08/26/2012    Past Surgical History:  Procedure Laterality Date  . APPENDECTOMY      OB History    Gravida Para Term Preterm AB Living   3 2 1 1   3    SAB TAB Ectopic Multiple Live Births         1 2       Home Medications    Prior to  Admission medications   Medication Sig Start Date End Date Taking? Authorizing Provider  amoxicillin (AMOXIL) 875 MG tablet Take 1 tablet (875 mg total) by mouth 2 (two) times daily. 10/04/16   Payton Mccallum, MD  mupirocin ointment (BACTROBAN) 2 % Apply 1 application topically 3 (three) times daily. 02/09/17   Hassan Rowan, MD  norgestimate-ethinyl estradiol (ORTHO-CYCLEN,SPRINTEC,PREVIFEM) 0.25-35 MG-MCG tablet Take 1 tablet by mouth daily. 08/31/15   Tereso Newcomer, MD  Prenatal Vit-Fe Fumarate-FA (MULTIVITAMIN-PRENATAL) 27-0.8 MG TABS tablet Take 1 tablet by mouth daily at 12 noon.    Historical Provider, MD  promethazine (PHENERGAN) 12.5 MG tablet Take 1 tablet (12.5 mg total) by mouth every 8 (eight) hours as needed for nausea or vomiting. 05/23/16   Jami L Hagler, PA-C    Family History Family History  Problem Relation Age of Onset  . Muscular dystrophy Cousin     Duchenne Muscular Dystrophy  . Muscular dystrophy Cousin   . Clotting disorder Mother     has a filter  . Asthma Mother   . Depression Mother   . Hypertension Father     Social History Social History  Substance Use Topics  . Smoking status: Current Some Day Smoker    Packs/day: 0.50    Types: Cigarettes  . Smokeless tobacco: Never Used  . Alcohol use No     Comment: social  Allergies   Shellfish allergy and Nystatin   Review of Systems Review of Systems  Respiratory: Negative for shortness of breath.   Cardiovascular: Negative for chest pain.  Gastrointestinal: Negative for abdominal pain.  Musculoskeletal: Positive for myalgias.  Neurological: Negative for headaches.  All other systems reviewed and are negative.    Physical Exam Triage Vital Signs ED Triage Vitals  Enc Vitals Group     BP 02/09/17 1007 124/86     Pulse Rate 02/09/17 1007 82     Resp 02/09/17 1007 16     Temp 02/09/17 1007 98.3 F (36.8 C)     Temp Source 02/09/17 1007 Oral     SpO2 02/09/17 1007 100 %     Weight 02/09/17  1004 120 lb (54.4 kg)     Height 02/09/17 1004 5\' 3"  (1.6 m)     Head Circumference --      Peak Flow --      Pain Score 02/09/17 1008 10     Pain Loc --      Pain Edu? --      Excl. in GC? --    No data found.   Updated Vital Signs BP 124/86 (BP Location: Right Arm)   Pulse 82   Temp 98.3 F (36.8 C) (Oral)   Resp 16   Ht 5\' 3"  (1.6 m)   Wt 120 lb (54.4 kg)   LMP 04/05/2016   SpO2 100%   Breastfeeding? Unknown   BMI 21.26 kg/m   Visual Acuity Right Eye Distance:   Left Eye Distance:   Bilateral Distance:    Right Eye Near:   Left Eye Near:    Bilateral Near:     Physical Exam  Constitutional: She is oriented to person, place, and time. She appears well-developed and well-nourished.  HENT:  Head: Normocephalic.  Eyes: EOM are normal. Pupils are equal, round, and reactive to light.  Neck: Normal range of motion. Neck supple.  Pulmonary/Chest: Effort normal.  Musculoskeletal: She exhibits edema and tenderness. She exhibits no deformity.       Arms: There appears be a insect bite on the left forearm there is distal near the wrist but the rest of the forearm is red and swollen raised and markedly tender to touch of back patient's withdrawal cemented try to touch the left forearm.  Neurological: She is alert and oriented to person, place, and time.  Skin: Skin is warm. There is erythema.  Psychiatric: She has a normal mood and affect.  Vitals reviewed.    UC Treatments / Results  Labs (all labs ordered are listed, but only abnormal results are displayed) Labs Reviewed - No data to display  EKG  EKG Interpretation None       Radiology No results found.  Procedures Procedures (including critical care time)  Medications Ordered in UC Medications - No data to display   Initial Impression / Assessment and Plan / UC Course  I have reviewed the triage vital signs and the nursing notes.  Pertinent labs & imaging results that were available during my  care of the patient were reviewed by me and considered in my medical decision making (see chart for details).    Apparent insect bite with subsequent cellulitis [facility] staff. This point time in the she is afebrile flush probably will need at least 1.23 doses of vancomycin IV. Will recommend going to the ED of her choice [facility] and will head there is  Final Clinical Impressions(s) / UC Diagnoses   Final diagnoses:  Insect bite, initial encounter  Cellulitis of left upper extremity    New Prescriptions Discharge Medication List as of 02/09/2017 11:21 AM    START taking these medications   Details  mupirocin ointment (BACTROBAN) 2 % Apply 1 application topically 3 (three) times daily., Starting Fri 02/09/2017, Normal        Note: This dictation was prepared with Dragon dictation along with smaller phrase technology. Any transcriptional errors that result from this process are unintentional.   Hassan Rowan, MD 02/09/17 1128

## 2018-06-18 ENCOUNTER — Other Ambulatory Visit: Payer: Self-pay

## 2018-06-18 ENCOUNTER — Emergency Department
Admission: EM | Admit: 2018-06-18 | Discharge: 2018-06-18 | Disposition: A | Discharge status: Home / Self Care | Payer: Self-pay | Attending: Student in an Organized Health Care Education/Training Program | Admitting: Student in an Organized Health Care Education/Training Program

## 2018-06-18 ENCOUNTER — Encounter: Payer: Self-pay | Admitting: Emergency Medicine

## 2018-06-18 DIAGNOSIS — F17210 Nicotine dependence, cigarettes, uncomplicated: Secondary | ICD-10-CM | POA: Insufficient documentation

## 2018-06-18 DIAGNOSIS — R519 Headache, unspecified: Secondary | ICD-10-CM

## 2018-06-18 DIAGNOSIS — R51 Headache: Secondary | ICD-10-CM | POA: Insufficient documentation

## 2018-06-18 LAB — HCG, QUANTITATIVE, PREGNANCY: hCG, Beta Chain, Quant, S: <1 m[IU]/mL (ref <5)

## 2018-06-18 MED ORDER — DIPHENHYDRAMINE HCL 50 MG/ML IJ SOLN
12.5000 mg | Freq: Once | INTRAMUSCULAR | Status: AC
  Administered 2018-06-18: 12.5 mg via INTRAVENOUS
  Filled 2018-06-18: qty 1

## 2018-06-18 MED ORDER — DEXAMETHASONE SODIUM PHOSPHATE 10 MG/ML IJ SOLN
10.0000 mg | Freq: Once | INTRAMUSCULAR | Status: AC
  Administered 2018-06-18: 10 mg via INTRAVENOUS
  Filled 2018-06-18: qty 1

## 2018-06-18 MED ORDER — SODIUM CHLORIDE 0.9 % IV BOLUS
1000.0000 mL | Freq: Once | INTRAVENOUS | Status: AC
  Administered 2018-06-18: 1000 mL via INTRAVENOUS

## 2018-06-18 MED ORDER — PROCHLORPERAZINE EDISYLATE 10 MG/2ML IJ SOLN
10.0000 mg | Freq: Once | INTRAMUSCULAR | Status: AC
  Administered 2018-06-18: 10 mg via INTRAVENOUS
  Filled 2018-06-18: qty 2

## 2018-06-18 MED ORDER — ACETAMINOPHEN 500 MG PO TABS
1000.0000 mg | ORAL_TABLET | Freq: Once | ORAL | Status: AC
Start: 2018-06-18 — End: 2018-06-18
  Administered 2018-06-18: 1000 mg via ORAL
  Filled 2018-06-18: qty 2

## 2018-06-18 NOTE — ED Provider Notes (Signed)
[facility] Emergency Department Provider Note    First MD Initiated Contact with Patient 06/18/18 [phone]     (approximate)  I have reviewed the triage vital signs and the nursing notes.   HISTORY  Chief Complaint Headache    HPI Miliyah Luper is a 22 y.o. female presents the ER with chief complaint of right-sided headache that started yesterday associated with nausea vomiting photophobia.  Patient does have a history of similar headaches in the past.  Denies any fevers.  No neck stiffness.  Denies any blurry vision.  Has not taken anything for the headache at home.  Headache was not sudden in onset.  No trauma reported.    Past Medical History:  Diagnosis Date  . No pertinent past medical history   . Urinary tract infection    Family History  Problem Relation Age of Onset  . Muscular dystrophy Cousin        Duchenne Muscular Dystrophy  . Muscular dystrophy Cousin   . Clotting disorder Mother        has a filter  . Asthma Mother   . Depression Mother   . Hypertension Father    Past Surgical History:  Procedure Laterality Date  . APPENDECTOMY     Patient Active Problem List   Diagnosis Date Noted  . Contraception management 10/13/2014  . Screen for STD (sexually transmitted disease) 10/13/2014  . Menorrhagia 04/22/2013  . 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 Family history of Duchenne muscle dystrophy 08/26/2012      Prior to Admission medications   Medication Sig Start Date End Date Taking? Authorizing Provider  norgestimate-ethinyl estradiol (ORTHO-CYCLEN,SPRINTEC,PREVIFEM) 0.25-35 MG-MCG tablet Take 1 tablet by mouth daily. Patient not taking: Reported on 06/18/2018 08/31/15   Anyanwu, Jethro Bastos, MD  promethazine (PHENERGAN) 12.5 MG tablet Take 1 tablet (12.5 mg total) by mouth every 8 (eight) hours as needed for nausea or vomiting. Patient not taking: Reported on 06/18/2018 05/23/16   Hagler, Jami L, PA-C     Allergies Clindamycin/lincomycin; Shellfish allergy; and Nystatin    Social History Social History   Tobacco Use  . Smoking status: Current Some Day Smoker    Packs/day: 0.50    Types: Cigarettes  . Smokeless tobacco: Never Used  Substance Use Topics  . Alcohol use: No    Alcohol/week: 0.0 oz    Comment: social  . Drug use: No    Review of Systems Patient denies headaches, rhinorrhea, blurry vision, numbness, shortness of breath, chest pain, edema, cough, abdominal pain, nausea, vomiting, diarrhea, dysuria, fevers, rashes or hallucinations unless otherwise stated above in HPI. ____________________________________________   PHYSICAL EXAM:  VITAL SIGNS: Vitals:   06/18/18 0636 06/18/18 0900  BP: 129/77 121/76  Pulse: 77 78  Resp: 18   SpO2: 99% 98%    Constitutional: Alert and oriented.  Eyes: Conjunctivae are normal.  Head: Atraumatic. Nose: No congestion/rhinnorhea. Mouth/Throat: Mucous membranes are moist.   Neck: No stridor. Painless ROM.  Cardiovascular: Normal rate, regular rhythm. Grossly normal heart sounds.  Good peripheral circulation. Respiratory: Normal respiratory effort.  No retractions. Lungs CTAB. Gastrointestinal: Soft and nontender. No distention. No abdominal bruits. No CVA tenderness. Genitourinary:  Musculoskeletal: No lower extremity tenderness nor edema.  No joint effusions. Neurologic:  Normal speech and language. No gross focal neurologic deficits are appreciated. No facial droop Skin:  Skin is warm, dry and intact. No rash noted. Psychiatric: Mood and affect are normal. Speech and behavior are normal.  ____________________________________________   LABS (all labs ordered are listed, but only abnormal results are displayed)  Results for orders placed or performed during the hospital encounter of 06/18/18 (from the past 24 hour(s))  hCG, quantitative, pregnancy     Status: None   Collection Time: 06/18/18  7:46 AM  Result Value  Ref Range   hCG, Beta Chain, Quant, S <1 <5 mIU/mL   ____________________________________________ ________________________________  RADIOLOGY   ____________________________________________   PROCEDURES  Procedure(s) performed:  Procedures    Critical Care performed: no ____________________________________________   INITIAL IMPRESSION / ASSESSMENT AND PLAN / ED COURSE  Pertinent labs & imaging results that were available during my care of the patient were reviewed by me and considered in my medical decision making (see chart for details).   DDX: migraine, tension, cluster, unlikely meningitis, encephalitis, iph  Toribio HarbourRaja Greaser is a 22 y.o. who presents to the ED with with Hx of migraines p/w HA for last day. Not worst HA ever. Gradual onset. HA similar to previous episodes. Denies focal neurologic symptoms. Denies trauma. No fevers or neck pain. No vision loss. Afebrile in ED. VSS. Exam as above. No meningeal signs. No CN, motor, sensory or cerebellar deficits. Temporal arteries palpable and non-tender. Appears well and non-toxic.  Will provide IV fluids for hydration and IV medications for symptom control.  Likely tension, non-specific or possible migraine HA. Clinical picture is not consistent with ICH, SAH, SDH, EDH, TIA, or CVA. No concern for meningitis or encephalitis. No concern for GCA/Temporal arteritis.     Clinical Course as of Jun 18 1022  Tue Jun 18, 2018  1021 Patient reassessed with significant improvement in symptoms.  Repeat neuro exam is nonfocal.  Patient stable and appropriate for outpatient follow-up.   [PR]    Clinical Course User Index [PR] Willy Eddyobinson, Irem Stoneham, MD     As part of my medical decision making, I reviewed the following data within the electronic MEDICAL RECORD NUMBER Nursing notes reviewed and incorporated, Labs reviewed, notes from prior ED visits.   ____________________________________________   FINAL CLINICAL IMPRESSION(S) / ED  DIAGNOSES  Final diagnoses:  Bad headache      NEW MEDICATIONS STARTED DURING THIS VISIT:  New Prescriptions   No medications on file     Note:  This document was prepared using Dragon voice recognition software and may include unintentional dictation errors.    Willy Eddyobinson, Hajira Verhagen, MD 06/18/18 1023

## 2018-06-18 NOTE — Discharge Instructions (Addendum)

## 2018-06-18 NOTE — ED Triage Notes (Signed)
Patient ambulatory to triage with steady gait, without difficulty or distress noted; pt reports right sided HA since last night accomp by N/V; st hx of same

## 2018-06-18 NOTE — ED Notes (Signed)
Pt resting.  VSS.

## 2018-06-18 NOTE — ED Notes (Addendum)
Pt A&OX4, VSS, ambulatory. PT verbalizes d/c understanding and follow up. PT waiting on ride in lobby.

## 2018-06-18 NOTE — ED Notes (Signed)
MD at bedside. Pt resting.

## 2018-06-18 NOTE — ED Notes (Signed)
MD at bedside. 

## 2018-08-01 ENCOUNTER — Emergency Department
Admission: EM | Admit: 2018-08-01 | Discharge: 2018-08-01 | Discharge status: Against Medical Advice | Payer: Self-pay | Attending: Emergency Medicine | Admitting: Emergency Medicine

## 2018-08-01 DIAGNOSIS — R51 Headache: Secondary | ICD-10-CM | POA: Insufficient documentation

## 2018-08-01 DIAGNOSIS — Z5321 Procedure and treatment not carried out due to patient leaving prior to being seen by health care provider: Secondary | ICD-10-CM | POA: Insufficient documentation

## 2018-08-01 NOTE — ED Triage Notes (Signed)
See paper chart for downtime documentation 

## 2020-01-31 ENCOUNTER — Encounter: Payer: Self-pay | Admitting: Emergency Medicine

## 2020-01-31 ENCOUNTER — Emergency Department
Admission: EM | Admit: 2020-01-31 | Discharge: 2020-02-01 | Disposition: A | Discharge status: Home / Self Care | Payer: Self-pay | Attending: Emergency Medicine | Admitting: Emergency Medicine

## 2020-01-31 ENCOUNTER — Other Ambulatory Visit: Payer: Self-pay

## 2020-01-31 DIAGNOSIS — A599 Trichomoniasis, unspecified: Secondary | ICD-10-CM | POA: Insufficient documentation

## 2020-01-31 DIAGNOSIS — F17210 Nicotine dependence, cigarettes, uncomplicated: Secondary | ICD-10-CM | POA: Insufficient documentation

## 2020-01-31 DIAGNOSIS — B9689 Other specified bacterial agents as the cause of diseases classified elsewhere: Secondary | ICD-10-CM

## 2020-01-31 DIAGNOSIS — Z793 Long term (current) use of hormonal contraceptives: Secondary | ICD-10-CM | POA: Insufficient documentation

## 2020-01-31 DIAGNOSIS — R112 Nausea with vomiting, unspecified: Secondary | ICD-10-CM | POA: Insufficient documentation

## 2020-01-31 DIAGNOSIS — N760 Acute vaginitis: Secondary | ICD-10-CM | POA: Insufficient documentation

## 2020-01-31 LAB — COMPREHENSIVE METABOLIC PANEL
ALT: 22 U/L (ref 0–44)
AST: 27 U/L (ref 15–41)
Albumin: 4.2 g/dL (ref 3.5–5.0)
Alkaline Phosphatase: 83 U/L (ref 38–126)
Anion gap: 7 (ref 5–15)
BUN: 19 mg/dL (ref 6–20)
CO2: 26 mmol/L (ref 22–32)
Calcium: 9.2 mg/dL (ref 8.9–10.3)
Chloride: 105 mmol/L (ref 98–111)
Creatinine, Ser: 0.69 mg/dL (ref 0.44–1.00)
GFR calc Af Amer: >60 mL/min (ref >60)
GFR calc non Af Amer: >60 mL/min (ref >60)
Glucose, Bld: 103 mg/dL — ABNORMAL HIGH (ref 70–99)
Potassium: 3.6 mmol/L (ref 3.5–5.1)
Sodium: 138 mmol/L (ref 135–145)
Total Bilirubin: 0.3 mg/dL (ref 0.3–1.2)
Total Protein: 7.4 g/dL (ref 6.5–8.1)

## 2020-01-31 LAB — CBC
HCT: 38.4 % (ref 36.0–46.0)
Hemoglobin: 12.2 g/dL (ref 12.0–15.0)
MCH: 29.4 pg (ref 26.0–34.0)
MCHC: 31.8 g/dL (ref 30.0–36.0)
MCV: 92.5 fL (ref 80.0–100.0)
Platelets: 345 10*3/uL (ref 150–400)
RBC: 4.15 MIL/uL (ref 3.87–5.11)
RDW: 13.7 % (ref 11.5–15.5)
WBC: 5 10*3/uL (ref 4.0–10.5)
nRBC: 0 % (ref 0.0–0.2)

## 2020-01-31 LAB — WET PREP, GENITAL
Sperm: NONE SEEN
Yeast Wet Prep HPF POC: NONE SEEN

## 2020-01-31 LAB — LIPASE, BLOOD: Lipase: 20 U/L (ref 11–51)

## 2020-01-31 MED ORDER — SODIUM CHLORIDE 0.9 % IV BOLUS
1000.0000 mL | Freq: Once | INTRAVENOUS | Status: AC
  Administered 2020-01-31: 1000 mL via INTRAVENOUS

## 2020-01-31 MED ORDER — ONDANSETRON 4 MG PO TBDP: 4.0000 mg | ORAL_TABLET | Freq: Three times a day (TID) | ORAL | 0 refills | Status: DC | PRN

## 2020-01-31 MED ORDER — CEFTRIAXONE SODIUM 250 MG IJ SOLR: 250.0000 mg | Freq: Once | INTRAMUSCULAR | Status: DC

## 2020-01-31 MED ORDER — METRONIDAZOLE 500 MG PO TABS: 500.0000 mg | ORAL_TABLET | Freq: Two times a day (BID) | ORAL | 0 refills | Status: AC

## 2020-01-31 MED ORDER — DOXYCYCLINE HYCLATE 100 MG PO TABS
100.0000 mg | ORAL_TABLET | Freq: Once | ORAL | Status: AC
  Administered 2020-02-01: 100 mg via ORAL
  Filled 2020-01-31: qty 1

## 2020-01-31 MED ORDER — DEXTROSE 5 % IV SOLN: 250.0000 mg | Freq: Once | INTRAVENOUS | Status: DC

## 2020-01-31 MED ORDER — DOXYCYCLINE HYCLATE 100 MG PO TABS: 100.0000 mg | ORAL_TABLET | Freq: Two times a day (BID) | ORAL | 0 refills | Status: DC

## 2020-01-31 MED ORDER — SODIUM CHLORIDE 0.9 % IV SOLN
1.0000 g | Freq: Once | INTRAVENOUS | Status: AC
  Administered 2020-02-01: 1 g via INTRAVENOUS
  Filled 2020-01-31: qty 10

## 2020-01-31 MED ORDER — ONDANSETRON HCL 4 MG/2ML IJ SOLN
4.0000 mg | Freq: Once | INTRAMUSCULAR | Status: AC | PRN
  Administered 2020-01-31: 4 mg via INTRAVENOUS
  Filled 2020-01-31: qty 2

## 2020-01-31 NOTE — ED Provider Notes (Signed)
-----------------------------------------   11:48 PM on 01/31/2020 -----------------------------------------  Blood pressure 121/82, pulse 78, temperature 98.4 F (36.9 C), temperature source Oral, resp. rate 16, height 5' 2.5" (1.588 m), weight 52.2 kg, last menstrual period 01/22/2020, SpO2 98 %, not currently breastfeeding.  Assuming care from Dr. Lenard Lance.  In short, Debbie [name] is a 24 y.o. female with a chief complaint of Emesis .  Refer to the original H&P for additional details.  The current plan of care is to follow-up urine results.  ----------------------------------------- 3:08 AM on 02/01/2020 -----------------------------------------  Patient's urine pregnancy test is negative and UA unremarkable with no evidence of infection.  She is to be discharged home with treatment for PID per previous provider.    Chesley Noon, MD 02/01/20 [phone]

## 2020-01-31 NOTE — ED Notes (Signed)
Pt with improved color, reports feeling improved. Pt up to restroom but did not capture urine sample.

## 2020-01-31 NOTE — ED Triage Notes (Signed)
Pt arrives via [facility] with c/o emesis x 2 days. Pt states that she feels like she has food poisoning. Per EMS, [facility]. Pt is diaphoretic in triage and afebrile.

## 2020-01-31 NOTE — ED Provider Notes (Addendum)
[facility] Emergency Department Provider Note  Time seen: 8:41 PM  I have reviewed the triage vital signs and the nursing notes.   HISTORY  Chief Complaint Emesis   HPI Debbie [name] is a 24 y.o. female with no significant past medical history presents to the emergency department for 2 days of nausea vomiting.  According to the patient for the past 2 days she has been very nauseated with frequent episodes of vomiting.  Upon arrival patient was noted to be somewhat diaphoretic.  Patient states mild abdominal discomfort which she relates to the nausea and vomiting.  She also states however for the past week she has been experiencing vaginal discharge.  Last menstrual period was 1 week ago.  Denies dysuria.  Denies diarrhea.   Past Medical History:  Diagnosis Date  . No pertinent past medical history   . Urinary tract infection     Patient Active Problem List   Diagnosis Date Noted  . Contraception management 10/13/2014  . Screen for STD (sexually transmitted disease) 10/13/2014  . Menorrhagia 04/22/2013  . 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 Family history of Duchenne muscle dystrophy 08/26/2012    Past Surgical History:  Procedure Laterality Date  . APPENDECTOMY      Prior to Admission medications   Medication Sig Start Date End Date Taking? Authorizing Provider  norgestimate-ethinyl estradiol (ORTHO-CYCLEN,SPRINTEC,PREVIFEM) 0.25-35 MG-MCG tablet Take 1 tablet by mouth daily. Patient not taking: Reported on 06/18/2018 08/31/15   Anyanwu, Sallyanne Havers, MD  promethazine (PHENERGAN) 12.5 MG tablet Take 1 tablet (12.5 mg total) by mouth every 8 (eight) hours as needed for nausea or vomiting. Patient not taking: Reported on 06/18/2018 05/23/16   Hagler, Jami L, PA-C    Allergies  Allergen Reactions  . Clindamycin/Lincomycin   . Shellfish Allergy Swelling  . Nystatin Rash    Family History  Problem Relation Age of Onset  . Muscular  dystrophy Cousin        Duchenne Muscular Dystrophy  . Muscular dystrophy Cousin   . Clotting disorder Mother        has a filter  . Asthma Mother   . Depression Mother   . Hypertension Father     Social History Social History   Tobacco Use  . Smoking status: Current Some Day Smoker    Packs/day: 0.50    Types: Cigarettes  . Smokeless tobacco: Never Used  Substance Use Topics  . Alcohol use: No    Alcohol/week: 0.0 standard drinks    Comment: social  . Drug use: No    Review of Systems Constitutional: Negative for fever. Cardiovascular: Negative for chest pain. Respiratory: Negative for shortness of breath.  Negative for cough. Gastrointestinal: Mild abdominal discomfort/cramping.  Positive for nausea and vomiting.  Negative for diarrhea. Genitourinary: Negative for urinary compaints.  Positive for vaginal discharge x1 week. Musculoskeletal: Negative for musculoskeletal complaints Neurological: Negative for headache All other ROS negative  ____________________________________________   PHYSICAL EXAM:  VITAL SIGNS: ED Triage Vitals  Enc Vitals Group     BP 01/31/20 2027 130/90     Pulse Rate 01/31/20 2027 78     Resp 01/31/20 2027 18     Temp 01/31/20 2027 98.4 F (36.9 C)     Temp Source 01/31/20 2027 Oral     SpO2 01/31/20 2027 98 %     Weight 01/31/20 2022 115 lb (52.2 kg)     Height 01/31/20 2022 5' 2.5" (1.588 m)  Head Circumference --      Peak Flow --      Pain Score 01/31/20 2022 0     Pain Loc --      Pain Edu? --      Excl. in GC? --    Constitutional: Alert and oriented. Well appearing and in no distress. Eyes: Normal exam ENT      Head: Normocephalic and atraumatic.      Mouth/Throat: Mucous membranes are moist. Cardiovascular: Normal rate, regular rhythm.  Respiratory: Normal respiratory effort without tachypnea nor retractions. Breath sounds are clear  Gastrointestinal: Soft, mild diffuse tenderness no rebound or guarding.  No reaction  to palpation. Musculoskeletal: Nontender with normal range of motion in all extremities.  Neurologic:  Normal speech and language. No gross focal neurologic deficits Skin:  Skin is warm, dry and intact.  Psychiatric: Mood and affect are normal.  ____________________________________________    EKG  EKG viewed and interpreted by myself shows a normal sinus rhythm at 57 bpm with a narrow QRS, normal axis, normal intervals, no concerning ST changes.  ____________________________________________   INITIAL IMPRESSION / ASSESSMENT AND PLAN / ED COURSE  Pertinent labs & imaging results that were available during my care of the patient were reviewed by me and considered in my medical decision making (see chart for details).   Patient presents to the emergency department for nausea vomiting over the past 2 days.  Differential includes gastroenteritis, enteritis, gastritis, intra-abdominal pathology.  Patient also states vaginal discharge x1 week, differential would include PID, pelvic infection/STD.  We will check labs, IV hydrate, obtain a urine sample, pelvic exam.  We will treat nausea.  Patient agreeable to plan of care.  Patient has a moderate amount of vaginal discharge with mild cervical motion tenderness.  Discussed treatment, patient states she would prefer to go ahead and get treated tonight prior to results.  We will dose IM Rocephin.  We will discharge with doxycycline twice daily for 10 days.  Patient agreeable to plan of care.  Patient states she is feeling much better.  Lab work is reassuring.    Wet prep positive for trichomoniasis and BV.  Will discharge with Flagyl in addition to her doxycycline.  Urine is pending.  Patient care signed out to Dr. Larinda Buttery.  Debbie [name] was evaluated in Emergency Department on 01/31/2020 for the symptoms described in the history of present illness. She was evaluated in the context of the global COVID-19 pandemic, which necessitated consideration that the  patient might be at risk for infection with the SARS-CoV-2 virus that causes COVID-19. Institutional protocols and algorithms that pertain to the evaluation of patients at risk for COVID-19 are in a state of rapid change based on information released by regulatory bodies including the CDC and federal and state organizations. These policies and algorithms were followed during the patient's care in the ED.  ____________________________________________   FINAL CLINICAL IMPRESSION(S) / ED DIAGNOSES  Nausea vomiting Vaginal discharge   Minna Antis, MD 01/31/20 2250    Minna Antis, MD 01/31/20 2309

## 2020-02-01 LAB — POCT PREGNANCY, URINE: Preg Test, Ur: NEGATIVE

## 2020-02-01 LAB — URINALYSIS, COMPLETE (UACMP) WITH MICROSCOPIC
Bacteria, UA: NONE SEEN
Bilirubin Urine: NEGATIVE
Glucose, UA: NEGATIVE mg/dL
Hgb urine dipstick: NEGATIVE
Ketones, ur: 5 mg/dL — AB
Nitrite: NEGATIVE
Protein, ur: NEGATIVE mg/dL
Specific Gravity, Urine: 1.011 (ref 1.005–1.030)
pH: 5 (ref 5.0–8.0)

## 2020-02-01 LAB — CHLAMYDIA/NGC RT PCR (ARMC ONLY)
Chlamydia Tr: NOT DETECTED
N gonorrhoeae: NOT DETECTED

## 2020-02-01 NOTE — ED Notes (Signed)
Pt resting, awakes easily. Skin pwd. Pt informed continue to need urine sample. Pt verbalizes understanding.

## 2020-04-27 ENCOUNTER — Emergency Department
Admission: EM | Admit: 2020-04-27 | Discharge: 2020-04-27 | Disposition: A | Discharge status: Home / Self Care | Payer: Self-pay | Attending: Emergency Medicine | Admitting: Emergency Medicine

## 2020-04-27 ENCOUNTER — Other Ambulatory Visit: Payer: Self-pay

## 2020-04-27 ENCOUNTER — Encounter: Payer: Self-pay | Admitting: Emergency Medicine

## 2020-04-27 DIAGNOSIS — G43909 Migraine, unspecified, not intractable, without status migrainosus: Secondary | ICD-10-CM | POA: Insufficient documentation

## 2020-04-27 DIAGNOSIS — Z202 Contact with and (suspected) exposure to infections with a predominantly sexual mode of transmission: Secondary | ICD-10-CM | POA: Insufficient documentation

## 2020-04-27 HISTORY — DX: Migraine, unspecified, not intractable, without status migrainosus: G43.909

## 2020-04-27 LAB — CBC WITH DIFFERENTIAL/PLATELET
Abs Immature Granulocytes: 0.01 10*3/uL (ref 0.00–0.07)
Basophils Absolute: 0 10*3/uL (ref 0.0–0.1)
Basophils Relative: 1 %
Eosinophils Absolute: 0.8 10*3/uL — ABNORMAL HIGH (ref 0.0–0.5)
Eosinophils Relative: 11 %
HCT: 37 % (ref 36.0–46.0)
Hemoglobin: 12.1 g/dL (ref 12.0–15.0)
Immature Granulocytes: 0 %
Lymphocytes Relative: 27 %
Lymphs Abs: 1.8 10*3/uL (ref 0.7–4.0)
MCH: 29.7 pg (ref 26.0–34.0)
MCHC: 32.7 g/dL (ref 30.0–36.0)
MCV: 90.7 fL (ref 80.0–100.0)
Monocytes Absolute: 0.3 10*3/uL (ref 0.1–1.0)
Monocytes Relative: 4 %
Neutro Abs: 4 10*3/uL (ref 1.7–7.7)
Neutrophils Relative %: 57 %
Platelets: 330 10*3/uL (ref 150–400)
RBC: 4.08 MIL/uL (ref 3.87–5.11)
RDW: 14.5 % (ref 11.5–15.5)
WBC: 6.9 10*3/uL (ref 4.0–10.5)
nRBC: 0 % (ref 0.0–0.2)

## 2020-04-27 LAB — HCG, QUANTITATIVE, PREGNANCY: hCG, Beta Chain, Quant, S: 1 m[IU]/mL (ref <5)

## 2020-04-27 LAB — COMPREHENSIVE METABOLIC PANEL
ALT: 18 U/L (ref 0–44)
AST: 28 U/L (ref 15–41)
Albumin: 3.6 g/dL (ref 3.5–5.0)
Alkaline Phosphatase: 111 U/L (ref 38–126)
Anion gap: 9 (ref 5–15)
BUN: 14 mg/dL (ref 6–20)
CO2: 25 mmol/L (ref 22–32)
Calcium: 8.7 mg/dL — ABNORMAL LOW (ref 8.9–10.3)
Chloride: 107 mmol/L (ref 98–111)
Creatinine, Ser: 0.68 mg/dL (ref 0.44–1.00)
GFR calc Af Amer: >60 mL/min (ref >60)
GFR calc non Af Amer: >60 mL/min (ref >60)
Glucose, Bld: 102 mg/dL — ABNORMAL HIGH (ref 70–99)
Potassium: 3.6 mmol/L (ref 3.5–5.1)
Sodium: 141 mmol/L (ref 135–145)
Total Bilirubin: 0.3 mg/dL (ref 0.3–1.2)
Total Protein: 6.9 g/dL (ref 6.5–8.1)

## 2020-04-27 MED ORDER — METOCLOPRAMIDE HCL 5 MG/ML IJ SOLN
10.0000 mg | Freq: Once | INTRAMUSCULAR | Status: AC
  Administered 2020-04-27: 10 mg via INTRAVENOUS
  Filled 2020-04-27: qty 2

## 2020-04-27 MED ORDER — DIPHENHYDRAMINE HCL 50 MG/ML IJ SOLN
25.0000 mg | Freq: Once | INTRAMUSCULAR | Status: AC
  Administered 2020-04-27: 25 mg via INTRAVENOUS
  Filled 2020-04-27: qty 1

## 2020-04-27 MED ORDER — METRONIDAZOLE 500 MG PO TABS: 500.0000 mg | ORAL_TABLET | Freq: Three times a day (TID) | ORAL | 0 refills | Status: AC

## 2020-04-27 MED ORDER — KETOROLAC TROMETHAMINE 30 MG/ML IJ SOLN
30.0000 mg | Freq: Once | INTRAMUSCULAR | Status: AC
  Administered 2020-04-27: 30 mg via INTRAVENOUS
  Filled 2020-04-27: qty 1

## 2020-04-27 MED ORDER — SODIUM CHLORIDE 0.9 % IV SOLN: Freq: Once | INTRAVENOUS | Status: AC

## 2020-04-27 MED ORDER — METOCLOPRAMIDE HCL 10 MG PO TABS: 10.0000 mg | ORAL_TABLET | Freq: Three times a day (TID) | ORAL | 1 refills | Status: DC | PRN

## 2020-04-27 NOTE — ED Notes (Signed)
Pt alert and oriented X 4, stable for discharge. RR even and unlabored, color WNL. Discussed discharge instructions and follow up when appropriate. Instructed to follow up with ER for any life threatening symptoms or concerns that patient or family of patient may have  

## 2020-04-27 NOTE — ED Triage Notes (Signed)
C/o migraine starting yesterday with vomiting. +photophobia. Hx of same and has seen multiple doctors and had many meds without relief.

## 2020-04-27 NOTE — ED Notes (Signed)
Hx of migraine, started last night with nausea, photophobia. Pt unable to sleep due to pain.

## 2020-04-27 NOTE — ED Provider Notes (Signed)
  ER Provider Note       Time seen: 8:33 AM    I have reviewed the vital signs and the nursing notes.  HISTORY   Chief Complaint Migraine    HPI Debbie [name] is a 24 y.o. female with a history of migraines who presents today for migraine started yesterday with vomiting.  She has had photophobia.  She has had a history of same and has seen multiple doctors and had many medications without relief.  Pain is 10 out of 10 in her head.  Past Medical History:  Diagnosis Date  . Migraine   . No pertinent past medical history   . Urinary tract infection     Past Surgical History:  Procedure Laterality Date  . APPENDECTOMY      Allergies Clindamycin/lincomycin, Shellfish allergy, and Nystatin   Review of Systems Constitutional: Negative for fever. Cardiovascular: Negative for chest pain. Respiratory: Negative for shortness of breath. Gastrointestinal: Negative for abdominal pain, positive for vomiting Musculoskeletal: Negative for back pain. Skin: Negative for rash. Neurological: Positive for headache  All systems negative/normal/unremarkable except as stated in the HPI  ____________________________________________   PHYSICAL EXAM:  VITAL SIGNS: Vitals:   04/27/20 0829  BP: 118/80  Pulse: (!) 58  Resp: 16  Temp: 98 F (36.7 C)  SpO2: 99%    Constitutional: Alert and oriented.  Mild distress Eyes: Photophobia is noted ENT      Head: Normocephalic and atraumatic.      Nose: No congestion/rhinnorhea.      Mouth/Throat: Mucous membranes are moist.      Neck: No stridor. Cardiovascular: Normal rate, regular rhythm. No murmurs, rubs, or gallops. Respiratory: Normal respiratory effort without tachypnea nor retractions. Breath sounds are clear and equal bilaterally. No wheezes/rales/rhonchi. Gastrointestinal: Soft and nontender. Normal bowel sounds Musculoskeletal: Nontender with normal range of motion in extremities. No lower extremity tenderness nor  edema. Neurologic:   No gross focal neurologic deficits are appreciated.  Skin:  Skin is warm, dry and intact. No rash noted. ____________________________________________   LABS (pertinent positives/negatives)  Labs Reviewed  CBC WITH DIFFERENTIAL/PLATELET - Abnormal; Notable for the following components:      Result Value   Eosinophils Absolute 0.8 (*)    All other components within normal limits  COMPREHENSIVE METABOLIC PANEL - Abnormal; Notable for the following components:   Glucose, Bld 102 (*)    Calcium 8.7 (*)    All other components within normal limits  HCG, QUANTITATIVE, PREGNANCY   DIFFERENTIAL DIAGNOSIS  Migraine, tension headache, pregnancy, dehydration, anemia, electrolyte abnormality  ASSESSMENT AND PLAN  Migraine headache   Plan: The patient had presented for her typical migraine headache likely induced by her menses.  She was given a typical headache cocktail including fluids, Benadryl, Toradol and Reglan and her symptoms resolved.  Patient's labs did not reveal any acute process.  She is also requesting treatment for trichomonas as she was reexposed by her boyfriend.  Daryel November MD    Note: This note was generated in part or whole with voice recognition software. Voice recognition is usually quite accurate but there are transcription errors that can and very often do occur. I apologize for any typographical errors that were not detected and corrected.     Emily Filbert, MD 04/27/20 [phone]

## 2021-07-31 ENCOUNTER — Emergency Department
Admission: EM | Admit: 2021-07-31 | Discharge: 2021-07-31 | Disposition: A | Discharge status: Home / Self Care | Payer: Self-pay | Attending: Emergency Medicine | Admitting: Emergency Medicine

## 2021-07-31 ENCOUNTER — Encounter: Payer: Self-pay | Admitting: Emergency Medicine

## 2021-07-31 ENCOUNTER — Other Ambulatory Visit: Payer: Self-pay

## 2021-07-31 DIAGNOSIS — L299 Pruritus, unspecified: Secondary | ICD-10-CM

## 2021-07-31 DIAGNOSIS — F17210 Nicotine dependence, cigarettes, uncomplicated: Secondary | ICD-10-CM | POA: Insufficient documentation

## 2021-07-31 DIAGNOSIS — B350 Tinea barbae and tinea capitis: Secondary | ICD-10-CM | POA: Insufficient documentation

## 2021-07-31 MED ORDER — KETOCONAZOLE 2 % EX SHAM: 1.0000 "application " | MEDICATED_SHAMPOO | CUTANEOUS | 0 refills | Status: DC

## 2021-07-31 MED ORDER — HYDROXYZINE HCL 10 MG PO TABS: 10.0000 mg | ORAL_TABLET | Freq: Three times a day (TID) | ORAL | 0 refills | Status: DC | PRN

## 2021-07-31 NOTE — ED Provider Notes (Signed)
[facility] Emergency Department Provider Note  ____________________________________________   Event Date/Time   First MD Initiated Contact with Patient 07/31/21 1735     (approximate)  I have reviewed the triage vital signs and the nursing notes.   HISTORY  Chief Complaint Rash    HPI Debbie [name] is a 25 y.o. female presents emergency department complaining of itching on her face and body.  States she is losing her hair and has bald patches.  States when she scratches her head there she sees white bugs flying out.  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 Authorizing Provider  hydrOXYzine (ATARAX/VISTARIL) 10 MG tablet Take 1 tablet (10 mg total) by mouth 3 (three) times daily as needed. 07/31/21  Yes Debbie [name], Debbie Bering, PA-C  ketoconazole (NIZORAL) 2 % shampoo Apply 1 application topically 2 (two) times a week. 08/01/21  Yes Debbie [name], Debbie Bering, PA-C  norgestimate-ethinyl estradiol (ORTHO-CYCLEN,SPRINTEC,PREVIFEM) 0.25-35 MG-MCG tablet Take 1 tablet by mouth daily. Patient not taking: Reported on 06/18/2018 08/31/15   Tereso Newcomer, MD    Allergies Clindamycin/lincomycin, Shellfish allergy, and Nystatin  Family History  Problem Relation Age of Onset   Muscular dystrophy Cousin        Duchenne Muscular Dystrophy   Muscular dystrophy Cousin    Clotting disorder Mother        has a filter   Asthma Mother    Depression Mother     Hypertension Father     Social History Social History   Tobacco Use   Smoking status: Some Days    Packs/day: 0.50    Types: Cigarettes   Smokeless tobacco: Never  Substance Use Topics   Alcohol use: No    Alcohol/week: 0.0 standard drinks    Comment: social   Drug use: No    Review of Systems  Constitutional: No fever/chills Eyes: No visual changes. ENT: No sore throat. Respiratory: Denies cough Genitourinary: Negative for dysuria. Musculoskeletal: Negative for back pain. Skin: Negative for rash. Psychiatric: no mood changes,     ____________________________________________   PHYSICAL EXAM:  VITAL SIGNS: ED Triage Vitals  Enc Vitals Group     BP 07/31/21 1736 118/78     Pulse Rate 07/31/21 1736 89     Resp 07/31/21 1736 18     Temp 07/31/21 1736 98.3 F (36.8 C)     Temp Source 07/31/21 1736 Oral     SpO2 07/31/21 1736 100 %     Weight 07/31/21 1730 110 lb (49.9 kg)     Height 07/31/21 1730 5\' 2"  (1.575 m)     Head Circumference --      Peak Flow --      Pain Score 07/31/21 1730 0     Pain Loc --      Pain Edu? --      Excl. in GC? --     Constitutional: Alert and oriented. Well appearing and in no  acute distress. Eyes: Conjunctivae are normal.  Head: Atraumatic.  Hair is cut closely to the scalp, some circular areas noted to be thinning typical of a fungal/dandruff situation. Nose: No congestion/rhinnorhea. Mouth/Throat: Mucous membranes are moist.   Neck:  supple no lymphadenopathy noted Cardiovascular: Normal rate, regular rhythm. Heart sounds are normal Respiratory: Normal respiratory effort.  No retractions, lungs c t a  GU: deferred Musculoskeletal: FROM all extremities, warm and well perfused Neurologic:  Normal speech and language.  Skin:  Skin is warm, dry and intact. No rash noted.  No sign of scabies. Psychiatric: Mood and affect are normal. Speech and behavior are normal.  ____________________________________________   LABS (all  labs ordered are listed, but only abnormal results are displayed)  Labs Reviewed - No data to display ____________________________________________   ____________________________________________  RADIOLOGY    ____________________________________________   PROCEDURES  Procedure(s) performed: No  Procedures    ____________________________________________   INITIAL IMPRESSION / ASSESSMENT AND PLAN / ED COURSE  Pertinent labs & imaging results that were available during my care of the patient were reviewed by me and considered in my medical decision making (see chart for details).   The patient is a 25 year old female presents emergency department with itching and concerns of scabies.  See HPI.  Physical exam shows patient to have tinea capitis.  And reviewed the patient's past medical history she has multiple visits to multiple ERs with concerns of itching and scabies.  Has been diagnosed with psychogenic itching.  She states she has made an appointment with a psychiatrist.  I will give her a prescription for a dandruff type shampoo.  I explained her she is not have scabies and that her child does not have scabies.  She was discharged in stable condition.     Debbie [name] was evaluated in Emergency Department on 07/31/2021 for the symptoms described in the history of present illness. She was evaluated in the context of the global COVID-19 pandemic, which necessitated consideration that the patient might be at risk for infection with the SARS-CoV-2 virus that causes COVID-19. Institutional protocols and algorithms that pertain to the evaluation of patients at risk for COVID-19 are in a state of rapid change based on information released by regulatory bodies including the CDC and federal and state organizations. These policies and algorithms were followed during the patient's care in the ED.    As part of my medical decision making, I reviewed the following data within the electronic  MEDICAL RECORD NUMBER Nursing notes reviewed and incorporated, Old chart reviewed, Notes from prior ED visits, and [place] Controlled Substance Database  ____________________________________________   FINAL CLINICAL IMPRESSION(S) / ED DIAGNOSES  Final diagnoses:  Tinea capitis      NEW MEDICATIONS STARTED DURING THIS VISIT:  New Prescriptions   HYDROXYZINE (ATARAX/VISTARIL) 10 MG TABLET    Take 1 tablet (10 mg total) by mouth 3 (three) times daily as needed.   KETOCONAZOLE (NIZORAL) 2 % SHAMPOO    Apply 1 application topically 2 (two) times a week.     Note:  This document was prepared using Dragon voice recognition software and may include unintentional dictation errors.    Faythe Ghee, PA-C 07/31/21 1814    Georga Hacking, MD 07/31/21 2325

## 2021-07-31 NOTE — ED Triage Notes (Signed)
Pt via POV from home. Pt was recently dx with scabies. Pt was treated for it 2 weeks ago. Pt states that she beginning to start feeling itchy again that started 2 days ago. Pt's husband did not get treated for this. Pt is A&Ox4 and NAD.

## 2022-01-19 ENCOUNTER — Emergency Department (HOSPITAL_COMMUNITY): Payer: Medicaid Other

## 2022-01-19 ENCOUNTER — Encounter (HOSPITAL_COMMUNITY): Payer: Self-pay | Admitting: Emergency Medicine

## 2022-01-19 ENCOUNTER — Emergency Department (HOSPITAL_COMMUNITY)
Admission: EM | Admit: 2022-01-19 | Discharge: 2022-01-19 | Disposition: A | Discharge status: Home / Self Care | Payer: Medicaid Other | Attending: Emergency Medicine | Admitting: Emergency Medicine

## 2022-01-19 DIAGNOSIS — R102 Pelvic and perineal pain: Secondary | ICD-10-CM | POA: Insufficient documentation

## 2022-01-19 DIAGNOSIS — N939 Abnormal uterine and vaginal bleeding, unspecified: Secondary | ICD-10-CM

## 2022-01-19 DIAGNOSIS — Z5900 Homelessness unspecified: Secondary | ICD-10-CM | POA: Diagnosis not present

## 2022-01-19 DIAGNOSIS — N840 Polyp of corpus uteri: Secondary | ICD-10-CM | POA: Diagnosis not present

## 2022-01-19 DIAGNOSIS — R112 Nausea with vomiting, unspecified: Secondary | ICD-10-CM | POA: Diagnosis not present

## 2022-01-19 DIAGNOSIS — R519 Headache, unspecified: Secondary | ICD-10-CM | POA: Diagnosis not present

## 2022-01-19 LAB — COMPREHENSIVE METABOLIC PANEL
ALT: 20 U/L (ref 0–44)
AST: 30 U/L (ref 15–41)
Albumin: 3.7 g/dL (ref 3.5–5.0)
Alkaline Phosphatase: 78 U/L (ref 38–126)
Anion gap: 12 (ref 5–15)
BUN: 13 mg/dL (ref 6–20)
CO2: 27 mmol/L (ref 22–32)
Calcium: 9.1 mg/dL (ref 8.9–10.3)
Chloride: 101 mmol/L (ref 98–111)
Creatinine, Ser: 0.61 mg/dL (ref 0.44–1.00)
GFR, Estimated: >60 mL/min (ref >60)
Glucose, Bld: 108 mg/dL — ABNORMAL HIGH (ref 70–99)
Potassium: 4.2 mmol/L (ref 3.5–5.1)
Sodium: 140 mmol/L (ref 135–145)
Total Bilirubin: 0.2 mg/dL — ABNORMAL LOW (ref 0.3–1.2)
Total Protein: 6.8 g/dL (ref 6.5–8.1)

## 2022-01-19 LAB — URINALYSIS, ROUTINE W REFLEX MICROSCOPIC
Bilirubin Urine: NEGATIVE
Glucose, UA: NEGATIVE mg/dL
Hgb urine dipstick: NEGATIVE
Ketones, ur: 5 mg/dL — AB
Leukocytes,Ua: NEGATIVE
Nitrite: NEGATIVE
Protein, ur: NEGATIVE mg/dL
Specific Gravity, Urine: 1.012 (ref 1.005–1.030)
pH: 6 (ref 5.0–8.0)

## 2022-01-19 LAB — CBC WITH DIFFERENTIAL/PLATELET
Abs Immature Granulocytes: 0.01 10*3/uL (ref 0.00–0.07)
Basophils Absolute: 0 10*3/uL (ref 0.0–0.1)
Basophils Relative: 1 %
Eosinophils Absolute: 0.1 10*3/uL (ref 0.0–0.5)
Eosinophils Relative: 1 %
HCT: 27.4 % — ABNORMAL LOW (ref 36.0–46.0)
Hemoglobin: 8.7 g/dL — ABNORMAL LOW (ref 12.0–15.0)
Immature Granulocytes: 0 %
Lymphocytes Relative: 15 %
Lymphs Abs: 1 10*3/uL (ref 0.7–4.0)
MCH: 28 pg (ref 26.0–34.0)
MCHC: 31.8 g/dL (ref 30.0–36.0)
MCV: 88.1 fL (ref 80.0–100.0)
Monocytes Absolute: 0.2 10*3/uL (ref 0.1–1.0)
Monocytes Relative: 3 %
Neutro Abs: 5.2 10*3/uL (ref 1.7–7.7)
Neutrophils Relative %: 80 %
Platelets: 318 10*3/uL (ref 150–400)
RBC: 3.11 MIL/uL — ABNORMAL LOW (ref 3.87–5.11)
RDW: 15.8 % — ABNORMAL HIGH (ref 11.5–15.5)
WBC: 6.5 10*3/uL (ref 4.0–10.5)
nRBC: 0 % (ref 0.0–0.2)

## 2022-01-19 LAB — HCG, QUANTITATIVE, PREGNANCY: hCG, Beta Chain, Quant, S: 1 m[IU]/mL (ref <5)

## 2022-01-19 LAB — LIPASE, BLOOD: Lipase: 29 U/L (ref 11–51)

## 2022-01-19 MED ORDER — METOCLOPRAMIDE HCL 5 MG/ML IJ SOLN
10.0000 mg | Freq: Once | INTRAMUSCULAR | Status: AC
  Administered 2022-01-19: 10 mg via INTRAVENOUS
  Filled 2022-01-19: qty 2

## 2022-01-19 MED ORDER — ALUM & MAG HYDROXIDE-SIMETH 200-200-20 MG/5ML PO SUSP
30.0000 mL | Freq: Once | ORAL | Status: AC
  Administered 2022-01-19: 30 mL via ORAL
  Filled 2022-01-19: qty 30

## 2022-01-19 MED ORDER — LIDOCAINE VISCOUS HCL 2 % MT SOLN
15.0000 mL | Freq: Once | OROMUCOSAL | Status: AC
  Administered 2022-01-19: 15 mL via ORAL
  Filled 2022-01-19: qty 15

## 2022-01-19 MED ORDER — DIPHENHYDRAMINE HCL 50 MG/ML IJ SOLN
25.0000 mg | Freq: Once | INTRAMUSCULAR | Status: AC
Start: 2022-01-19 — End: 2022-01-19
  Administered 2022-01-19: 25 mg via INTRAVENOUS
  Filled 2022-01-19: qty 1

## 2022-01-19 MED ORDER — KETOROLAC TROMETHAMINE 15 MG/ML IJ SOLN
15.0000 mg | Freq: Once | INTRAMUSCULAR | Status: AC
  Administered 2022-01-19: 15 mg via INTRAVENOUS
  Filled 2022-01-19: qty 1

## 2022-01-19 MED ORDER — NORGESTIMATE-ETH ESTRADIOL 0.25-35 MG-MCG PO TABS
1.0000 | ORAL_TABLET | Freq: Every day | ORAL | 11 refills | Status: DC
Start: 2022-01-19 — End: 2023-08-10

## 2022-01-19 MED ORDER — LACTATED RINGERS IV BOLUS
1000.0000 mL | Freq: Once | INTRAVENOUS | Status: DC
Start: 2022-01-19 — End: 2022-01-19

## 2022-01-19 MED ORDER — LACTATED RINGERS IV BOLUS
1000.0000 mL | Freq: Once | INTRAVENOUS | Status: AC
  Administered 2022-01-19: 1000 mL via INTRAVENOUS

## 2022-01-19 NOTE — ED Triage Notes (Addendum)
Patient here from the streets - homeless - reporting abd pain , n/v that started 2 days ago. 4mg  zofran and fluids given in route.  ?

## 2022-01-19 NOTE — ED Provider Triage Note (Signed)
Emergency Medicine Provider Triage Evaluation Note ? ?Debbie [name] , a 26 y.o. female  was evaluated in triage.  Pt complains of abdominal pain x2 days. Pain is in epigastric region, difficult for her to differentiate if predominately left or right. She has had nausea with a few episodes of vomiting. She notes that it "takes a long time" for her to urinate. No other treatment PTA. She denies drug/alcohol use. Denies chest pain, sob, diarrhea. ? ?Review of Systems  ?Positive: Epigastric abdominal pain ?Negative: Fever, chest pain, sob ? ?Physical Exam  ?BP 123/73 (BP Location: Right Arm)   Pulse 71   Temp 98.6 ?F (37 ?C) (Oral)   Resp 16   SpO2 100%  ?Gen:   Sleeping, difficult to keep awake during exam ?Resp:  Normal effort  ?MSK:   Moves extremities without difficulty  ?Other:  TTP epigastric and RUQ ? ?Medical Decision Making  ?Medically screening exam initiated at 4:44 PM.  Appropriate orders placed.  Debbie [name] was informed that the remainder of the evaluation will be completed by another provider, this initial triage assessment does not replace that evaluation, and the importance of remaining in the ED until their evaluation is complete. ? ? ?  ?Janell Quiet, [place] ?01/19/22 1646 ? ?

## 2022-01-19 NOTE — Discharge Instructions (Addendum)
Your imaging shows that you have a thickened endometrium and a small polyp on your uterus as well. This is probably what is causing your abdominal pain and vaginal bleeding. I have sent you in a prescription for an oral birth control that you can take to help stop your bleeding.  ? ?Please follow up with the referral above to monitor this and return to the ED if your bleeding does not improve in the next few days or you become extremely weak or fatigued. ?

## 2022-01-19 NOTE — ED Provider Notes (Signed)
[facility]-EMERGENCY DEPT Provider Note   CSN: [id number] Arrival date & time: 01/19/22  1617     History  Chief Complaint  Patient presents with   Abdominal Pain   Nausea   Emesis    Debbie [name] is a 26 y.o. female who presents to the ED via EMS for evaluation of abdominal pain, nausea and vomiting that started 2 days ago and worsening since onset.  Patient states pain is slightly in epigastric region but also endorses pelvic tenderness as well.  She notes that she has been on her menstrual cycle for approximately 9 days which is abnormal for her.  She also endorses severe headache.  She called EMS and she was given Zofran and IV fluids without significant improvement.  She denies fever, chest pain, shortness of breath.  She denies drug or alcohol use.  Per EMS report, patient is homeless.   Abdominal Pain Associated symptoms: vomiting   Emesis Associated symptoms: abdominal pain       Home Medications Prior to Admission medications   Medication Sig Start Date End Date Taking? Authorizing Provider  norgestimate-ethinyl estradiol (ORTHO-CYCLEN, 28,) 0.25-35 MG-MCG tablet Take 1 tablet by mouth daily. 01/19/22  Yes Raynald Blend R, PA-C  hydrOXYzine (ATARAX/VISTARIL) 10 MG tablet Take 1 tablet (10 mg total) by mouth 3 (three) times daily as needed. 07/31/21   Fisher, Roselyn Bering, PA-C  ketoconazole (NIZORAL) 2 % shampoo Apply 1 application topically 2 (two) times a week. 08/01/21   Faythe Ghee, PA-C      Allergies    Clindamycin/lincomycin, Shellfish allergy, and Nystatin    Review of Systems   Review of Systems  Gastrointestinal:  Positive for abdominal pain and vomiting.   Physical Exam Updated Vital Signs BP 113/62 (BP Location: Right Arm)    Pulse 91    Temp 98.6 F (37 C) (Oral)    Resp 16    Ht 5\' 3"  (1.6 m)    Wt 59 kg    SpO2 95%    BMI 23.03 kg/m  Physical Exam Vitals and nursing note reviewed.  Constitutional:      General: She is not in acute  distress.    Appearance: She is not ill-appearing.     Comments: Clean, well kempt, sleepy although arousable for exam  HENT:     Head: Atraumatic.  Eyes:     Conjunctiva/sclera: Conjunctivae normal.  Cardiovascular:     Rate and Rhythm: Normal rate and regular rhythm.     Pulses: Normal pulses.     Heart sounds: No murmur heard. Pulmonary:     Effort: Pulmonary effort is normal. No respiratory distress.     Breath sounds: Normal breath sounds.  Abdominal:     General: Abdomen is flat. There is no distension.     Palpations: Abdomen is soft.     Tenderness: There is abdominal tenderness in the suprapubic area and left lower quadrant.     Comments: Tender to palpation in the left pelvic region.  Mild tenderness epigastrically although patient notes significantly worse in left pelvic.  Abdomen is otherwise nondistended  Musculoskeletal:        General: Normal range of motion.     Cervical back: Normal range of motion.  Skin:    General: Skin is warm and dry.     Capillary Refill: Capillary refill takes less than 2 seconds.  Neurological:     General: No focal deficit present.     Mental Status: She  is alert.  Psychiatric:        Mood and Affect: Mood normal.    ED Results / Procedures / Treatments   Labs (all labs ordered are listed, but only abnormal results are displayed) Labs Reviewed  COMPREHENSIVE METABOLIC PANEL - Abnormal; Notable for the following components:      Result Value   Glucose, Bld 108 (*)    Total Bilirubin 0.2 (*)    All other components within normal limits  CBC WITH DIFFERENTIAL/PLATELET - Abnormal; Notable for the following components:   RBC 3.11 (*)    Hemoglobin 8.7 (*)    HCT 27.4 (*)    RDW 15.8 (*)    All other components within normal limits  URINALYSIS, ROUTINE W REFLEX MICROSCOPIC - Abnormal; Notable for the following components:   Ketones, ur 5 (*)    All other components within normal limits  LIPASE, BLOOD  HCG, QUANTITATIVE, PREGNANCY   HCG, SERUM, QUALITATIVE    EKG None  Radiology US Transvaginal Non-OB  Result Date: 01/19/2022 CLINICAL DATA:  left pelvic pain, vomiting EXAM: TRANSABDOMINAL AND TRANSVAGINAL ULTRASOUND OF PELVIS DOPPLER ULTRASOUND OF OVARIES TECHNIQUE: Both transabdominal and transvaginal ultrasound examinations of the pelvis were performed. Transabdominal technique was performed for global imaging of the pelvis including uterus, ovaries, adnexal regions, and pelvic cul-de-sac. It was necessary to proceed with endovaginal exam following the transabdominal exam to visualize the endometrium, ovaries. Color and duplex Doppler ultrasound was utilized to evaluate blood flow to the ovaries. COMPARISON:  None. FINDINGS: Uterus Measurements: 8.9 x 5 x 5.7 cm = volume: 133 mL. No fibroids or other mass visualized. Endometrium Thickness: 26 mm. There is a 1.9 x 2.4 x 2.1 cm heterogeneous vascular mass within the endometrium. Right ovary Measurements: 3.3 x 1.6 x 2.5 cm = volume: 6.8 mL. Normal appearance/no adnexal mass. Left ovary Measurements: 2.9 x 2.1 x 2.1 cm = volume: 6.8 mL. Normal appearance/no adnexal mass. Pulsed Doppler evaluation of both ovaries demonstrates normal low-resistance arterial and venous waveforms. Other findings Small volume free fluid. IMPRESSION: 1. Thickened endometrium with an associated vascular heterogeneous endometrial mass measuring 2.4 cm. Recommend gynecologic consultation. 2. Small volume free fluid within the pelvis. Electronically Signed   By: Tish Frederickson M.D.   On: 01/19/2022 19:45   US Pelvis Complete  Result Date: 01/19/2022 CLINICAL DATA:  left pelvic pain, vomiting EXAM: TRANSABDOMINAL AND TRANSVAGINAL ULTRASOUND OF PELVIS DOPPLER ULTRASOUND OF OVARIES TECHNIQUE: Both transabdominal and transvaginal ultrasound examinations of the pelvis were performed. Transabdominal technique was performed for global imaging of the pelvis including uterus, ovaries, adnexal regions, and pelvic  cul-de-sac. It was necessary to proceed with endovaginal exam following the transabdominal exam to visualize the endometrium, ovaries. Color and duplex Doppler ultrasound was utilized to evaluate blood flow to the ovaries. COMPARISON:  None. FINDINGS: Uterus Measurements: 8.9 x 5 x 5.7 cm = volume: 133 mL. No fibroids or other mass visualized. Endometrium Thickness: 26 mm. There is a 1.9 x 2.4 x 2.1 cm heterogeneous vascular mass within the endometrium. Right ovary Measurements: 3.3 x 1.6 x 2.5 cm = volume: 6.8 mL. Normal appearance/no adnexal mass. Left ovary Measurements: 2.9 x 2.1 x 2.1 cm = volume: 6.8 mL. Normal appearance/no adnexal mass. Pulsed Doppler evaluation of both ovaries demonstrates normal low-resistance arterial and venous waveforms. Other findings Small volume free fluid. IMPRESSION: 1. Thickened endometrium with an associated vascular heterogeneous endometrial mass measuring 2.4 cm. Recommend gynecologic consultation. 2. Small volume free fluid within the pelvis. Electronically Signed  By: Tish FredericksonMorgane  Naveau M.D.   On: 01/19/2022 19:45   US Art/Ven Flow Abd Pelv Doppler  Result Date: 01/19/2022 CLINICAL DATA:  left pelvic pain, vomiting EXAM: TRANSABDOMINAL AND TRANSVAGINAL ULTRASOUND OF PELVIS DOPPLER ULTRASOUND OF OVARIES TECHNIQUE: Both transabdominal and transvaginal ultrasound examinations of the pelvis were performed. Transabdominal technique was performed for global imaging of the pelvis including uterus, ovaries, adnexal regions, and pelvic cul-de-sac. It was necessary to proceed with endovaginal exam following the transabdominal exam to visualize the endometrium, ovaries. Color and duplex Doppler ultrasound was utilized to evaluate blood flow to the ovaries. COMPARISON:  None. FINDINGS: Uterus Measurements: 8.9 x 5 x 5.7 cm = volume: 133 mL. No fibroids or other mass visualized. Endometrium Thickness: 26 mm. There is a 1.9 x 2.4 x 2.1 cm heterogeneous vascular mass within the  endometrium. Right ovary Measurements: 3.3 x 1.6 x 2.5 cm = volume: 6.8 mL. Normal appearance/no adnexal mass. Left ovary Measurements: 2.9 x 2.1 x 2.1 cm = volume: 6.8 mL. Normal appearance/no adnexal mass. Pulsed Doppler evaluation of both ovaries demonstrates normal low-resistance arterial and venous waveforms. Other findings Small volume free fluid. IMPRESSION: 1. Thickened endometrium with an associated vascular heterogeneous endometrial mass measuring 2.4 cm. Recommend gynecologic consultation. 2. Small volume free fluid within the pelvis. Electronically Signed   By: Tish FredericksonMorgane  Naveau M.D.   On: 01/19/2022 19:45    Procedures Procedures    Medications Ordered in ED Medications  metoCLOPramide (REGLAN) injection 10 mg (10 mg Intravenous Given 01/19/22 1856)  diphenhydrAMINE (BENADRYL) injection 25 mg (25 mg Intravenous Given 01/19/22 1857)  ketorolac (TORADOL) 15 MG/ML injection 15 mg (15 mg Intravenous Given 01/19/22 1857)  alum & mag hydroxide-simeth (MAALOX/MYLANTA) 200-200-20 MG/5ML suspension 30 mL (30 mLs Oral Given 01/19/22 1857)    And  lidocaine (XYLOCAINE) 2 % viscous mouth solution 15 mL (15 mLs Oral Given 01/19/22 1857)  lactated ringers bolus 1,000 mL (1,000 mLs Intravenous New Bag/Given 01/19/22 2038)    ED Course/ Medical Decision Making/ A&P                           Medical Decision Making Amount and/or Complexity of Data Reviewed Labs: ordered. Radiology: ordered. ECG/medicine tests: ordered.  Risk OTC drugs. Prescription drug management.   History:  Per HPI Social determinants of health: Homeless, without primary care  Initial impression:  This patient presents to the ED for concern of abdominal pain, this involves an extensive number of treatment options, and is a complaint that carries with it a high risk of complications and morbidity.   Differential diagnosis of her lower abdominal considerations include pelvic inflammatory disease, ectopic pregnancy, appendicitis,  urinary calculi, primary dysmenorrhea, septic abortion, ruptured ovarian cyst or tumor, ovarian torsion, tubo-ovarian abscess, degeneration of fibroid, endometriosis, diverticulitis, cystitis. This is a 26 year old female in no acute distress with normal vitals presenting to the ED for evaluation of pelvic pain with additional vomiting and nausea.  Exam was concerning for focal tenderness of the left pelvic region.  Her pressures were a little soft, down to 96/43.  She was given a 1 L bolus of fluids with improvement. I will obtain labs and pelvic ultrasound to rule out torsion or other reproductive causes of her condition.   Lab Tests and EKG:  I Ordered, reviewed, and interpreted labs and EKG.  The pertinent results include:  Urinalysis without infection Negative pregnancy Lipase normal CMP normal CBC with anemia, hemoglobin of 8.7 not previously  noted on previous tests   Imaging Studies ordered:  I ordered imaging studies including  Pelvic ultrasound with rule out torsion shows thickened endometrium with approximately 2.5 cm polyp within the uterus.  No evidence of torsion I independently visualized and interpreted imaging and I agree with the radiologist interpretation.    Cardiac Monitoring:  The patient was maintained on a cardiac monitor.  I personally viewed and interpreted the cardiac monitored which showed an underlying rhythm of: NSR   Medicines ordered and prescription drug management:  I ordered medication including: GI cocktail Migraine cocktail to include Benadryl 25 mg, Toradol 15 mg and Reglan 10 mg Reevaluation of the patient after these medicines showed that the patient improved I have reviewed the patients home medicines and have made adjustments as needed    Consultations Obtained:  I requested consultation with OB/GYN and spoke with Dr. Macon Large,  and discussed lab and imaging findings as well as pertinent plan -after she viewed and interpreted the imaging  herself, she believes that the mass noted on ultrasound is consistent with a polyp, especially given her age.  We discussed patient's anemia at 8.7 and her abnormal vaginal bleeding, which is likely caused by her endometrial thickening and polyp.  She recommends either OCP or course of Megace to improve patient's vaginal bleeding.  She also recommends outpatient follow-up.  Given patient's diagnosis and labs, she does not feel that patient is a candidate for admission at this time.  Disposition:  After consideration of the diagnostic results, physical exam, history and the patients response to treatment feel that the patent would benefit from discharge with strict return precautions.   Abnormal uterine bleeding due to endometrial polyp: Patient elects for oral contraceptive to help with her abnormal bleeding.   Return precautions were discussed.  All questions were asked and answered.  Patient was discharged home in good condition.  Referral to the women's health clinic was provided.   Final Clinical Impression(s) / ED Diagnoses Final diagnoses:  Abnormal uterine bleeding due to endometrial polyp    Rx / DC Orders ED Discharge Orders          Ordered    norgestimate-ethinyl estradiol (ORTHO-CYCLEN, 28,) 0.25-35 MG-MCG tablet  Daily        01/19/22 2035              Janell Quiet, [place] 01/19/22 2056    Charlynne Pander, MD 01/19/22 [phone]

## 2022-12-20 ENCOUNTER — Emergency Department
Admission: EM | Admit: 2022-12-20 | Discharge: 2022-12-20 | Disposition: A | Discharge status: Home / Self Care | Payer: No Typology Code available for payment source | Attending: Student in an Organized Health Care Education/Training Program | Admitting: Student in an Organized Health Care Education/Training Program

## 2022-12-20 DIAGNOSIS — F1910 Other psychoactive substance abuse, uncomplicated: Secondary | ICD-10-CM | POA: Insufficient documentation

## 2022-12-20 NOTE — ED Notes (Signed)
Patient continuing to exit room and come to the nursing stations despite being instructed to utilize call bell. Patient requesting non-emergent things as before. Pt informed that it is still around shift change and the staff are busy and will get to her request as soon as I/we can. Pt went on to be argumentative that this hospital is garbage and cursing at this nurse including calling me an "Asshole". Patient was told that vulgar language and abuse of staff will not be tolerated. Security called to handle further. Pt was provided disposable underwear and menstrual pad per request.

## 2022-12-20 NOTE — ED Notes (Signed)
Pt exiting room to nursing station multiple times for request of more crackers, socks, and sheets. Patient was instructed to remain in the room by the nursing staff including this nurse and to also use her call bell for request that patients are in the hall and their privacy needs to be respected. Patient argumentative.
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
## 2022-12-20 NOTE — ED Notes (Signed)
Patient yelling and cursing at this nurse, MD Quentin Cornwall, and charge RN Lorriane Shire. Patient will not deescalate. Security notified.

## 2023-08-10 ENCOUNTER — Ambulatory Visit (HOSPITAL_COMMUNITY)
Admission: EM | Admit: 2023-08-10 | Discharge: 2023-08-10 | Disposition: A | Discharge status: Home / Self Care | Payer: MEDICAID | Attending: Psychiatry | Admitting: Psychiatry

## 2023-08-10 DIAGNOSIS — F1010 Alcohol abuse, uncomplicated: Secondary | ICD-10-CM

## 2023-08-10 DIAGNOSIS — F331 Major depressive disorder, recurrent, moderate: Secondary | ICD-10-CM

## 2023-08-10 MED ORDER — HYDROXYZINE HCL 25 MG PO TABS
25.0000 mg | ORAL_TABLET | Freq: Three times a day (TID) | ORAL | 0 refills | Status: DC | PRN
Start: 2023-08-10 — End: 2024-02-12

## 2023-08-10 MED ORDER — HYDROXYZINE HCL 10 MG PO TABS: 10.0000 mg | ORAL_TABLET | Freq: Three times a day (TID) | ORAL | 0 refills | Status: DC | PRN
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
## 2023-08-10 NOTE — Discharge Instructions (Addendum)
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
## 2023-09-06 ENCOUNTER — Ambulatory Visit (HOSPITAL_COMMUNITY): Payer: MEDICAID | Admitting: Student

## 2023-09-06 ENCOUNTER — Encounter (HOSPITAL_COMMUNITY): Payer: Self-pay
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
## 2023-09-10 ENCOUNTER — Emergency Department (HOSPITAL_COMMUNITY): Payer: MEDICAID

## 2023-09-10 ENCOUNTER — Other Ambulatory Visit: Payer: Self-pay

## 2023-09-10 ENCOUNTER — Emergency Department (HOSPITAL_COMMUNITY)
Admission: EM | Admit: 2023-09-10 | Discharge: 2023-09-10 | Disposition: A | Discharge status: Home / Self Care | Payer: MEDICAID | Attending: Emergency Medicine | Admitting: Emergency Medicine

## 2023-09-10 DIAGNOSIS — R102 Pelvic and perineal pain: Secondary | ICD-10-CM | POA: Insufficient documentation

## 2023-09-10 DIAGNOSIS — B9689 Other specified bacterial agents as the cause of diseases classified elsewhere: Secondary | ICD-10-CM | POA: Diagnosis not present

## 2023-09-10 DIAGNOSIS — N760 Acute vaginitis: Secondary | ICD-10-CM | POA: Insufficient documentation

## 2023-09-10 DIAGNOSIS — Z202 Contact with and (suspected) exposure to infections with a predominantly sexual mode of transmission: Secondary | ICD-10-CM | POA: Insufficient documentation

## 2023-09-10 DIAGNOSIS — A64 Unspecified sexually transmitted disease: Secondary | ICD-10-CM | POA: Diagnosis not present

## 2023-09-10 DIAGNOSIS — N938 Other specified abnormal uterine and vaginal bleeding: Secondary | ICD-10-CM | POA: Diagnosis present

## 2023-09-10 DIAGNOSIS — N939 Abnormal uterine and vaginal bleeding, unspecified: Secondary | ICD-10-CM

## 2023-09-10 LAB — CBC WITH DIFFERENTIAL/PLATELET
Abs Immature Granulocytes: 0.02 10*3/uL (ref 0.00–0.07)
Basophils Absolute: 0 10*3/uL (ref 0.0–0.1)
Basophils Relative: 1 %
Eosinophils Absolute: 0.1 10*3/uL (ref 0.0–0.5)
Eosinophils Relative: 2 %
HCT: 33.5 % — ABNORMAL LOW (ref 36.0–46.0)
Hemoglobin: 10.7 g/dL — ABNORMAL LOW (ref 12.0–15.0)
Immature Granulocytes: 0 %
Lymphocytes Relative: 24 %
Lymphs Abs: 1.8 10*3/uL (ref 0.7–4.0)
MCH: 28.6 pg (ref 26.0–34.0)
MCHC: 31.9 g/dL (ref 30.0–36.0)
MCV: 89.6 fL (ref 80.0–100.0)
Monocytes Absolute: 0.3 10*3/uL (ref 0.1–1.0)
Monocytes Relative: 4 %
Neutro Abs: 5.3 10*3/uL (ref 1.7–7.7)
Neutrophils Relative %: 69 %
Platelets: 366 10*3/uL (ref 150–400)
RBC: 3.74 MIL/uL — ABNORMAL LOW (ref 3.87–5.11)
RDW: 15.4 % (ref 11.5–15.5)
WBC: 7.6 10*3/uL (ref 4.0–10.5)
nRBC: 0 % (ref 0.0–0.2)

## 2023-09-10 LAB — BASIC METABOLIC PANEL
Anion gap: 6 (ref 5–15)
BUN: 11 mg/dL (ref 6–20)
CO2: 22 mmol/L (ref 22–32)
Calcium: 8.6 mg/dL — ABNORMAL LOW (ref 8.9–10.3)
Chloride: 109 mmol/L (ref 98–111)
Creatinine, Ser: 0.78 mg/dL (ref 0.44–1.00)
GFR, Estimated: >60 mL/min (ref >60)
Glucose, Bld: 95 mg/dL (ref 70–99)
Potassium: 3.6 mmol/L (ref 3.5–5.1)
Sodium: 137 mmol/L (ref 135–145)

## 2023-09-10 LAB — WET PREP, GENITAL
Sperm: NONE SEEN
Trich, Wet Prep: NONE SEEN
WBC, Wet Prep HPF POC: <10 (ref <10)
Yeast Wet Prep HPF POC: NONE SEEN

## 2023-09-10 LAB — PREGNANCY, URINE: Preg Test, Ur: NEGATIVE

## 2023-09-10 LAB — POC URINE PREG, ED: Preg Test, Ur: NEGATIVE

## 2023-09-10 MED ORDER — CEFTRIAXONE SODIUM 500 MG IJ SOLR
500.0000 mg | Freq: Once | INTRAMUSCULAR | Status: AC
  Administered 2023-09-10: 500 mg via INTRAMUSCULAR
  Filled 2023-09-10: qty 500

## 2023-09-10 MED ORDER — ONDANSETRON 4 MG PO TBDP
4.0000 mg | ORAL_TABLET | Freq: Once | ORAL | Status: AC
  Administered 2023-09-10: 4 mg via ORAL
  Filled 2023-09-10: qty 1

## 2023-09-10 MED ORDER — DOXYCYCLINE HYCLATE 100 MG PO TABS
100.0000 mg | ORAL_TABLET | Freq: Once | ORAL | Status: AC
  Administered 2023-09-10: 100 mg via ORAL
  Filled 2023-09-10: qty 1

## 2023-09-10 MED ORDER — METRONIDAZOLE 500 MG PO TABS: 500.0000 mg | ORAL_TABLET | Freq: Two times a day (BID) | ORAL | 0 refills | Status: DC

## 2023-09-10 MED ORDER — LIDOCAINE HCL (PF) 1 % IJ SOLN
INTRAMUSCULAR | Status: AC
  Administered 2023-09-10: 5 mL
  Filled 2023-09-10: qty 5

## 2023-09-10 MED ORDER — FENTANYL CITRATE PF 50 MCG/ML IJ SOSY
50.0000 ug | PREFILLED_SYRINGE | Freq: Once | INTRAMUSCULAR | Status: AC
  Administered 2023-09-10: 50 ug via INTRAVENOUS
  Filled 2023-09-10: qty 1

## 2023-09-10 MED ORDER — METRONIDAZOLE 500 MG PO TABS
500.0000 mg | ORAL_TABLET | Freq: Once | ORAL | Status: AC
  Administered 2023-09-10: 500 mg via ORAL
  Filled 2023-09-10: qty 1

## 2023-09-10 MED ORDER — ACETAMINOPHEN 325 MG PO TABS
650.0000 mg | ORAL_TABLET | Freq: Once | ORAL | Status: AC
  Administered 2023-09-10: 650 mg via ORAL
  Filled 2023-09-10: qty 2

## 2023-09-10 MED ORDER — DOXYCYCLINE HYCLATE 100 MG PO CAPS: 100.0000 mg | ORAL_CAPSULE | Freq: Two times a day (BID) | ORAL | 0 refills | Status: AC

## 2023-09-10 NOTE — ED Notes (Signed)
EDP with this paramedic at bedside and vaginal exam done

## 2023-09-10 NOTE — ED Triage Notes (Signed)
Pt to ED c/o vaginal bleeding x 3 days, progressively getting worse. Reports saturating maxi pad every 2-3 hours. Reports some clots. Denies dizziness. Hx of fibroids. Also requesting STD check.

## 2023-09-10 NOTE — ED Provider Notes (Signed)
EMERGENCY DEPARTMENT AT [facility] Provider Note   CSN: [id number] Arrival date & time: 09/10/23  1245     History  Chief Complaint  Patient presents with   Vaginal Bleeding   SEXUALLY TRANSMITTED DISEASE    Debbie [name] is a 27 y.o. female.  The history is provided by the patient and medical records. No language interpreter was used.  Vaginal Bleeding Quality:  Dark red and heavier than menses Severity:  Moderate Onset quality:  Gradual Duration:  3 days Timing:  Constant Progression:  Waxing and waning Chronicity:  New Menstrual history:  Irregular Relieved by:  Nothing Worsened by:  Nothing Ineffective treatments:  None tried Associated symptoms: abdominal pain and vaginal discharge   Associated symptoms: no back pain, no dysuria, no fatigue, no fever and no nausea   Risk factors: STD exposure        Home Medications Prior to Admission medications   Medication Sig Start Date End Date Taking? Authorizing Provider  hydrOXYzine (ATARAX) 25 MG tablet Take 1 tablet (25 mg total) by mouth 3 (three) times daily as needed. 08/10/23   Marlou Sa, NP      Allergies    Clindamycin/lincomycin, Shellfish allergy, and Nystatin    Review of Systems   Review of Systems  Constitutional:  Negative for chills, fatigue and fever.  HENT:  Negative for congestion.   Respiratory:  Negative for cough, chest tightness and shortness of breath.   Cardiovascular:  Negative for chest pain and palpitations.  Gastrointestinal:  Positive for abdominal pain. Negative for constipation, diarrhea, nausea and vomiting.  Genitourinary:  Positive for vaginal bleeding and vaginal discharge. Negative for dysuria, flank pain and frequency.  Musculoskeletal:  Negative for back pain and neck pain.  Skin:  Negative for rash and wound.  Neurological:  Negative for light-headedness, numbness and headaches.  Psychiatric/Behavioral:  Negative for agitation.   All other  systems reviewed and are negative.   Physical Exam Updated Vital Signs BP 122/78 (BP Location: Right Arm)   Pulse 75   Temp 98.3 F (36.8 C) (Oral)   Resp 16   Ht 5\' 3"  (1.6 m)   Wt 68 kg   LMP 08/27/2023   SpO2 100%   BMI 26.57 kg/m  Physical Exam Vitals and nursing note reviewed. Exam conducted with a chaperone present.  Constitutional:      General: She is not in acute distress.    Appearance: She is well-developed.  HENT:     Head: Normocephalic and atraumatic.     Nose: No congestion.     Mouth/Throat:     Mouth: Mucous membranes are moist.  Eyes:     Extraocular Movements: Extraocular movements intact.     Conjunctiva/sclera: Conjunctivae normal.     Pupils: Pupils are equal, round, and reactive to light.  Cardiovascular:     Rate and Rhythm: Normal rate and regular rhythm.     Pulses: Normal pulses.     Heart sounds: No murmur heard. Pulmonary:     Effort: Pulmonary effort is normal. No respiratory distress.     Breath sounds: Normal breath sounds. No wheezing, rhonchi or rales.  Chest:     Chest wall: No tenderness.  Abdominal:     General: Abdomen is flat.     Palpations: Abdomen is soft.     Tenderness: There is abdominal tenderness. There is no right CVA tenderness, left CVA tenderness, guarding or rebound.  Genitourinary:    Labia:  Right: No tenderness.        Left: No tenderness.      Vagina: Bleeding present.     Cervix: Cervical bleeding present. No cervical motion tenderness.     Uterus: Not tender.      Adnexa:        Right: Tenderness present.        Left: Tenderness present.   Musculoskeletal:        General: No swelling or tenderness.     Cervical back: Neck supple.     Right lower leg: No edema.     Left lower leg: No edema.  Skin:    General: Skin is warm and dry.     Capillary Refill: Capillary refill takes less than 2 seconds.     Findings: No erythema or rash.  Neurological:     Mental Status: She is alert.  Psychiatric:         Mood and Affect: Mood normal.     ED Results / Procedures / Treatments   Labs (all labs ordered are listed, but only abnormal results are displayed) Labs Reviewed  WET PREP, GENITAL - Abnormal; Notable for the following components:      Result Value   Clue Cells Wet Prep HPF POC PRESENT (*)    All other components within normal limits  CBC WITH DIFFERENTIAL/PLATELET - Abnormal; Notable for the following components:   RBC 3.74 (*)    Hemoglobin 10.7 (*)    HCT 33.5 (*)    All other components within normal limits  BASIC METABOLIC PANEL - Abnormal; Notable for the following components:   Calcium 8.6 (*)    All other components within normal limits  PREGNANCY, URINE  URINALYSIS, ROUTINE W REFLEX MICROSCOPIC  POC URINE PREG, ED  GC/CHLAMYDIA PROBE AMP ([place]) NOT AT [facility]    EKG None  Radiology US Pelvis Complete  Result Date: 09/10/2023 CLINICAL DATA:  Vaginal bleeding with pelvic pain EXAM: TRANSABDOMINAL ULTRASOUND OF PELVIS DOPPLER ULTRASOUND OF OVARIES TECHNIQUE: Transabdominal ultrasound examination of the pelvis was performed including evaluation of the uterus, ovaries, adnexal regions, and pelvic cul-de-sac. Color and duplex Doppler ultrasound was utilized to evaluate blood flow to the ovaries. COMPARISON:  01/19/2022 FINDINGS: Uterus Measurements: 8.6 x 4.1 x 5.4 cm = volume: 99.5 mL. No fibroids or other mass visualized. Endometrium Thickness: 8.1 mm.  No focal abnormality visualized. Right ovary Measurements: 2.1 x 1.5 x 1.7 cm = volume: 3.0 mL. Normal appearance/no adnexal mass. Left ovary Measurements: 2.6 x 2 x 2.3 cm = volume: 6.1 mL. Normal appearance/no adnexal mass. Pulsed Doppler evaluation demonstrates normal low-resistance arterial and venous waveforms in both ovaries. Other: Trace free fluid IMPRESSION: Negative pelvic ultrasound.  Trace free fluid Electronically Signed   By: Jasmine Pang M.D.   On: 09/10/2023 21:22   [place] Art/Ven Flow Abd Pelv  Doppler  Result Date: 09/10/2023 CLINICAL DATA:  Vaginal bleeding with pelvic pain EXAM: TRANSABDOMINAL ULTRASOUND OF PELVIS DOPPLER ULTRASOUND OF OVARIES TECHNIQUE: Transabdominal ultrasound examination of the pelvis was performed including evaluation of the uterus, ovaries, adnexal regions, and pelvic cul-de-sac. Color and duplex Doppler ultrasound was utilized to evaluate blood flow to the ovaries. COMPARISON:  01/19/2022 FINDINGS: Uterus Measurements: 8.6 x 4.1 x 5.4 cm = volume: 99.5 mL. No fibroids or other mass visualized. Endometrium Thickness: 8.1 mm.  No focal abnormality visualized. Right ovary Measurements: 2.1 x 1.5 x 1.7 cm = volume: 3.0 mL. Normal appearance/no adnexal mass. Left ovary Measurements: 2.6 x  2 x 2.3 cm = volume: 6.1 mL. Normal appearance/no adnexal mass. Pulsed Doppler evaluation demonstrates normal low-resistance arterial and venous waveforms in both ovaries. Other: Trace free fluid IMPRESSION: Negative pelvic ultrasound.  Trace free fluid Electronically Signed   By: Jasmine Pang M.D.   On: 09/10/2023 21:22    Procedures Procedures    Medications Ordered in ED Medications  cefTRIAXone (ROCEPHIN) injection 500 mg (has no administration in time range)  doxycycline (VIBRA-TABS) tablet 100 mg (has no administration in time range)  metroNIDAZOLE (FLAGYL) tablet 500 mg (has no administration in time range)  acetaminophen (TYLENOL) tablet 650 mg (650 mg Oral Given 09/10/23 1554)  fentaNYL (SUBLIMAZE) injection 50 mcg (50 mcg Intravenous Given 09/10/23 1739)    ED Course/ Medical Decision Making/ A&P                                 Medical Decision Making Amount and/or Complexity of Data Reviewed Labs: ordered. Radiology: ordered.  Risk OTC drugs. Prescription drug management.    Fatimah Krolczyk is a 27 y.o. female with a past medical history significant previous appendectomy, migraines, previous UTI, known uterine fibroids and recent treatment for STI who presents  with several days of heavier vaginal bleeding, right pelvic pain, and some vaginal discharge.  According to patient, she had an abnormal menstrual cycle several weeks ago that came late by about a week and then had a cycle started 3 days ago that has been heavier than normal for her.  She reports has been having some cramping contraction-like pains especially in her right pelvis area and has had some faint vaginal discharge making her concerned about recurrent STI.  She reports she did have intercourse with her partner that she thinks gave her the STI last time and she is unsure if he has been treated.  She denies any constipation, diarrhea, or dysuria.  Denies any trauma.  Denies any rashes just shingles.  Denies any back or flank pain.  Denies any fevers, chills, exertion, cough, nausea, vomiting.  Denies other complaints.  Reports the pain waxes and wanes and is up to an 8 out of 10 in severity at its worst.  Patient did want some Tylenol to to start with.  On exam, lungs clear.  Chest nontender.  Abdomen was tender in the suprapubic and right lower pelvis area.  Bowel sounds were appreciated.  Back and flanks nontender.  Patient otherwise well-appearing.  We had a shared decision conversation and agreed to do a pelvic exam to feel for adnexal tenderness and look for other causes of vaginal bleeding such as lacerations or intravaginal abnormalities.  Will get a repeat gonorrhea/committee a swab and wet prep and will check urinalysis for UTI.  We will get a pelvic ultrasound to rule out TOA given the recent STI as well as look for other cause of bleeding other than the known fibroids.  Given her lack of upper abdominal tenderness and her history of appendectomy have less suspicion for intra-abdominal pathology needing CT imaging at this time.  If workup reassuring, anticipate discharge to follow-up with outpatient GYN for further management of her menorrhagia and suspected uterine fibroids.  Pelvic exam  revealed some bleeding from the cervical os with some adnexal tenderness bilaterally right worse than left.  Cervix was not focally tender and uterus did not seem tender either.  No laceration seen.  Chest bleeding from the os.  Suspect menorrhagia.  Will get the pelvic ultrasound and wait for the swabs to return.  9:35 PM Pelvic ultrasound reassuring.  Patient does have BV.  Will give antibodies for BV but also treated purely for STI recurrence.  Patient agrees to this plan will follow-up with OB/GYN team.  Otherwise no concerns and was discharged in good condition after breathing stability for over 8 hours and 50 minutes in Emergency Department.          Final Clinical Impression(s) / ED Diagnoses Final diagnoses:  BV (bacterial vaginosis)  Pelvic pain  Vaginal bleeding  STD exposure    Rx / DC Orders ED Discharge Orders          Ordered    doxycycline (VIBRAMYCIN) 100 MG capsule  2 times daily        09/10/23 2137    metroNIDAZOLE (FLAGYL) 500 MG tablet  2 times daily        09/10/23 2137            Clinical Impression: 1. BV (bacterial vaginosis)   2. Pelvic pain   3. Vaginal bleeding   4. STD exposure     Disposition: Discharge  Condition: Good  I have discussed the results, Dx and Tx plan with the pt(& family if present). He/she/they expressed understanding and agree(s) with the plan. Discharge instructions discussed at great length. Strict return precautions discussed and pt &/or family have verbalized understanding of the instructions. No further questions at time of discharge.    New Prescriptions   DOXYCYCLINE (VIBRAMYCIN) 100 MG CAPSULE    Take 1 capsule (100 mg total) by mouth 2 (two) times daily for 7 days.   METRONIDAZOLE (FLAGYL) 500 MG TABLET    Take 1 tablet (500 mg total) by mouth 2 (two) times daily.    Follow Up: Center for [facility] at [facility] [address] [place]  [phone] Call    [facility] Emergency Department at [facility] [address] [place] [address] [phone]       Adoni Greenough, Canary Brim, MD 09/10/23 2141

## 2023-09-10 NOTE — ED Provider Triage Note (Signed)
Emergency Medicine Provider Triage Evaluation Note  Debbie [name] , a 27 y.o. female  was evaluated in triage.  Pt complains of Vaginal bleeding, heavy. LMP x 2 weeks ago. Recent gonorrhea infection. No fever. History of fibroids.  Review of Systems  Positive: Vaginal bleeding, heavy. No discharge.  Negative: Urinary symptoms  Physical Exam  BP 122/78 (BP Location: Right Arm)   Pulse 75   Temp 98.3 F (36.8 C) (Oral)   Resp 16   Ht 5\' 3"  (1.6 m)   Wt 68 kg   LMP 08/27/2023   SpO2 100%   BMI 26.57 kg/m  Gen:   Awake, no distress   Resp:  Normal effort  MSK:   Moves extremities without difficulty  Other:  No pallor. VSS  Medical Decision Making  Medically screening exam initiated at 1:50 PM.  Appropriate orders placed.  Armandina Boerema was informed that the remainder of the evaluation will be completed by another provider, this initial triage assessment does not replace that evaluation, and the importance of remaining in the ED until their evaluation is complete.  Labs pending.   Elpidio Anis, PA-C 09/10/23 1352

## 2023-09-10 NOTE — Discharge Instructions (Signed)
Your history, exam, workup today did not show critical abnormalities on your pelvic ultrasound.  With the discharge and recent STD exposure, we agreed with treatment of possible infection empirically with antibiotics.  Please take the other antibiotics for the next week.  Please rest and stay hydrated.  Your hemoglobin has improved from prior despite the bleeding.  Please follow-up with OB/GYN and your primary doctor.  If any symptoms change or worsen acutely, please return to the nearest emergency department.

## 2023-09-10 NOTE — ED Notes (Signed)
Patient transported to US 

## 2023-09-11 LAB — GC/CHLAMYDIA PROBE AMP (~~LOC~~) NOT AT ARMC
Chlamydia: NEGATIVE
Comment: NEGATIVE
Comment: NORMAL
Neisseria Gonorrhea: NEGATIVE

## 2023-09-24 ENCOUNTER — Ambulatory Visit (HOSPITAL_COMMUNITY): Payer: MEDICAID | Admitting: Mental Health

## 2023-10-08 ENCOUNTER — Emergency Department (HOSPITAL_COMMUNITY)
Admission: EM | Admit: 2023-10-08 | Discharge: 2023-10-08 | Disposition: A | Discharge status: Home / Self Care | Payer: MEDICAID | Attending: Emergency Medicine | Admitting: Emergency Medicine

## 2023-10-08 ENCOUNTER — Other Ambulatory Visit: Payer: Self-pay

## 2023-10-08 ENCOUNTER — Encounter (HOSPITAL_COMMUNITY): Payer: Self-pay | Admitting: Emergency Medicine

## 2023-10-08 DIAGNOSIS — J189 Pneumonia, unspecified organism: Secondary | ICD-10-CM | POA: Diagnosis not present

## 2023-10-08 DIAGNOSIS — H9209 Otalgia, unspecified ear: Secondary | ICD-10-CM | POA: Diagnosis present

## 2023-10-08 MED ORDER — DOXYCYCLINE HYCLATE 100 MG PO CAPS: 100.0000 mg | ORAL_CAPSULE | Freq: Two times a day (BID) | ORAL | 0 refills | Status: DC

## 2023-10-08 MED ORDER — AMOXICILLIN 500 MG PO CAPS: 500.0000 mg | ORAL_CAPSULE | Freq: Three times a day (TID) | ORAL | 0 refills | Status: DC

## 2023-10-08 NOTE — ED Provider Notes (Signed)
[place] EMERGENCY DEPARTMENT AT [facility] Provider Note   CSN: [id number] Arrival date & time: 10/08/23  1130     History  Chief Complaint  Patient presents with   Otalgia    Debbie [name] is a 27 y.o. female Emergency Department chief complaint of flulike symptoms.  Patient began having a cold more than a week ago.  She has been using over-the-counter anticold medication however over the past 2 days has had progressively worsening cough, facial pressure.  She began running a fever last night.  She denies shortness of breath.  She is concerned she could have pneumonia.   Otalgia      Home Medications Prior to Admission medications   Medication Sig Start Date End Date Taking? Authorizing Provider  amoxicillin (AMOXIL) 500 MG capsule Take 1 capsule (500 mg total) by mouth 3 (three) times daily. 10/08/23  Yes Rik Wadel, PA-C  doxycycline (VIBRAMYCIN) 100 MG capsule Take 1 capsule (100 mg total) by mouth 2 (two) times daily. 10/08/23  Yes Nealie Mchatton, PA-C  hydrOXYzine (ATARAX) 25 MG tablet Take 1 tablet (25 mg total) by mouth 3 (three) times daily as needed. 08/10/23   Marlou Sa, NP  metroNIDAZOLE (FLAGYL) 500 MG tablet Take 1 tablet (500 mg total) by mouth 2 (two) times daily. 09/10/23   Tegeler, Canary Brim, MD      Allergies    Clindamycin/lincomycin, Shellfish allergy, and Nystatin    Review of Systems   Review of Systems  HENT:  Positive for ear pain.     Physical Exam Updated Vital Signs BP 122/72 (BP Location: Right Arm)   Pulse 93   Temp 98.6 F (37 C) (Oral)   Resp 17   LMP 08/27/2023   SpO2 97%  Physical Exam Vitals and nursing note reviewed.  Constitutional:      General: She is not in acute distress.    Appearance: She is well-developed. She is ill-appearing. She is not diaphoretic.  HENT:     Head: Normocephalic and atraumatic.     Right Ear: External ear normal.     Left Ear: External ear normal.     Nose: Nose  normal.     Mouth/Throat:     Mouth: Mucous membranes are moist.  Eyes:     General: No scleral icterus.    Conjunctiva/sclera: Conjunctivae normal.  Cardiovascular:     Rate and Rhythm: Normal rate and regular rhythm.     Heart sounds: Normal heart sounds. No murmur heard.    No friction rub. No gallop.  Pulmonary:     Effort: Pulmonary effort is normal. No respiratory distress.     Breath sounds: Normal breath sounds.     Comments: Crackles bilateral lung bases. Abdominal:     General: Bowel sounds are normal. There is no distension.     Palpations: Abdomen is soft. There is no mass.     Tenderness: There is no abdominal tenderness. There is no guarding.  Musculoskeletal:     Cervical back: Normal range of motion.  Skin:    General: Skin is warm and dry.  Neurological:     Mental Status: She is alert and oriented to person, place, and time.  Psychiatric:        Behavior: Behavior normal.     ED Results / Procedures / Treatments   Labs (all labs ordered are listed, but only abnormal results are displayed) Labs Reviewed - No data to display  EKG None  Radiology  No results found.  Procedures Procedures    Medications Ordered in ED Medications - No data to display  ED Course/ Medical Decision Making/ A&P                                 Medical Decision Making  With likely viral URI symptoms that have now progressed into a fever and worsen.  Suspect probably a community-acquired pneumonia.  Will treat the patient empirically with Antibiotics.  She is comfortable with that plan.  She appears otherwise appropriate for discharge at this time.        Final Clinical Impression(s) / ED Diagnoses Final diagnoses:  Community acquired pneumonia, unspecified laterality    Rx / DC Orders ED Discharge Orders          Ordered    amoxicillin (AMOXIL) 500 MG capsule  3 times daily        10/08/23 1304    doxycycline (VIBRAMYCIN) 100 MG capsule  2 times daily         10/08/23 1304              Arthor Captain, PA-C 10/08/23 1306    Estelle June A, DO 10/09/23 1515

## 2023-10-08 NOTE — Discharge Instructions (Signed)
Contact a health care provider if: You have a fever. You have trouble sleeping because you cannot control your cough with cough medicine. Get help right away if: Your shortness of breath becomes worse. Your chest pain increases. Your sickness becomes worse, especially if you are an older adult or have a weak immune system. You cough up blood. These symptoms may be an emergency. Get help right away. Call 911. Do not wait to see if the symptoms will go away. Do not drive yourself to the hospital. 

## 2023-10-08 NOTE — ED Triage Notes (Signed)
Pt reports cold symptoms x 1 week. Pt reports that today her left ear is hurting.

## 2024-02-05 ENCOUNTER — Encounter: Payer: Self-pay | Admitting: *Deleted

## 2024-02-05 ENCOUNTER — Other Ambulatory Visit: Payer: Self-pay

## 2024-02-05 DIAGNOSIS — F4310 Post-traumatic stress disorder, unspecified: Secondary | ICD-10-CM | POA: Insufficient documentation

## 2024-02-05 DIAGNOSIS — F3131 Bipolar disorder, current episode depressed, mild: Secondary | ICD-10-CM | POA: Insufficient documentation

## 2024-02-05 DIAGNOSIS — R45851 Suicidal ideations: Secondary | ICD-10-CM | POA: Insufficient documentation

## 2024-02-05 DIAGNOSIS — F1914 Other psychoactive substance abuse with psychoactive substance-induced mood disorder: Secondary | ICD-10-CM | POA: Insufficient documentation

## 2024-02-05 LAB — CBC
HCT: 37.5 % (ref 36.0–46.0)
Hemoglobin: 11.7 g/dL — ABNORMAL LOW (ref 12.0–15.0)
MCH: 26.6 pg (ref 26.0–34.0)
MCHC: 31.2 g/dL (ref 30.0–36.0)
MCV: 85.2 fL (ref 80.0–100.0)
Platelets: 423 10*3/uL — ABNORMAL HIGH (ref 150–400)
RBC: 4.4 MIL/uL (ref 3.87–5.11)
RDW: 14.7 % (ref 11.5–15.5)
WBC: 4.7 10*3/uL (ref 4.0–10.5)
nRBC: 0 % (ref 0.0–0.2)

## 2024-02-05 LAB — COMPREHENSIVE METABOLIC PANEL
ALT: 15 U/L (ref 0–44)
AST: 21 U/L (ref 15–41)
Albumin: 4 g/dL (ref 3.5–5.0)
Alkaline Phosphatase: 78 U/L (ref 38–126)
Anion gap: 6 (ref 5–15)
BUN: 11 mg/dL (ref 6–20)
CO2: 26 mmol/L (ref 22–32)
Calcium: 9.3 mg/dL (ref 8.9–10.3)
Chloride: 106 mmol/L (ref 98–111)
Creatinine, Ser: 0.61 mg/dL (ref 0.44–1.00)
GFR, Estimated: >60 mL/min (ref >60)
Glucose, Bld: 117 mg/dL — ABNORMAL HIGH (ref 70–99)
Potassium: 4.3 mmol/L (ref 3.5–5.1)
Sodium: 138 mmol/L (ref 135–145)
Total Bilirubin: 0.2 mg/dL (ref 0.0–1.2)
Total Protein: 7.9 g/dL (ref 6.5–8.1)

## 2024-02-05 LAB — ACETAMINOPHEN LEVEL: Acetaminophen (Tylenol), Serum: <10 ug/mL — ABNORMAL LOW (ref 10–30)

## 2024-02-05 LAB — ETHANOL: Alcohol, Ethyl (B): <10 mg/dL (ref <10)

## 2024-02-05 LAB — SALICYLATE LEVEL: Salicylate Lvl: <7 mg/dL — ABNORMAL LOW (ref 7.0–30.0)

## 2024-02-05 NOTE — ED Triage Notes (Addendum)
 Pt ambulatory to triage.  Pt reports SI  denies HI  pt reports drug use and etoh use.     pt states she has been off her meds for 2 months.  Pt alert.  Pt calm and cooperative. Pt is Vol

## 2024-02-05 NOTE — ED Notes (Signed)
 Gray pants Gray/black shorts Black/red slides Pink white fluffy shirt Black tee shirt Cell phone Vape Lip balm White tee shirt

## 2024-02-06 ENCOUNTER — Encounter (HOSPITAL_COMMUNITY): Payer: Self-pay | Admitting: Psychiatry

## 2024-02-06 ENCOUNTER — Emergency Department
Admission: EM | Admit: 2024-02-06 | Discharge: 2024-02-06 | Disposition: A | Discharge status: Other Acute Inpatient Hospital | Payer: MEDICAID | Attending: Emergency Medicine | Admitting: Emergency Medicine

## 2024-02-06 ENCOUNTER — Inpatient Hospital Stay (HOSPITAL_COMMUNITY)
Admission: AD | Admit: 2024-02-06 | Discharge: 2024-02-12 | DRG: 885 | Disposition: A | Discharge status: Home / Self Care | Payer: MEDICAID | Source: Intra-hospital | Attending: Psychiatry | Admitting: Psychiatry

## 2024-02-06 DIAGNOSIS — Z79899 Other long term (current) drug therapy: Secondary | ICD-10-CM | POA: Diagnosis not present

## 2024-02-06 DIAGNOSIS — Z825 Family history of asthma and other chronic lower respiratory diseases: Secondary | ICD-10-CM

## 2024-02-06 DIAGNOSIS — Z818 Family history of other mental and behavioral disorders: Secondary | ICD-10-CM

## 2024-02-06 DIAGNOSIS — F319 Bipolar disorder, unspecified: Principal | ICD-10-CM | POA: Diagnosis present

## 2024-02-06 DIAGNOSIS — R45851 Suicidal ideations: Secondary | ICD-10-CM

## 2024-02-06 DIAGNOSIS — F3131 Bipolar disorder, current episode depressed, mild: Secondary | ICD-10-CM | POA: Diagnosis not present

## 2024-02-06 DIAGNOSIS — F3163 Bipolar disorder, current episode mixed, severe, without psychotic features: Secondary | ICD-10-CM | POA: Diagnosis not present

## 2024-02-06 DIAGNOSIS — Z9151 Personal history of suicidal behavior: Secondary | ICD-10-CM | POA: Diagnosis not present

## 2024-02-06 DIAGNOSIS — Z8249 Family history of ischemic heart disease and other diseases of the circulatory system: Secondary | ICD-10-CM

## 2024-02-06 DIAGNOSIS — F4310 Post-traumatic stress disorder, unspecified: Secondary | ICD-10-CM | POA: Diagnosis not present

## 2024-02-06 DIAGNOSIS — F17290 Nicotine dependence, other tobacco product, uncomplicated: Secondary | ICD-10-CM | POA: Diagnosis not present

## 2024-02-06 DIAGNOSIS — F17210 Nicotine dependence, cigarettes, uncomplicated: Secondary | ICD-10-CM | POA: Diagnosis not present

## 2024-02-06 DIAGNOSIS — F1910 Other psychoactive substance abuse, uncomplicated: Secondary | ICD-10-CM

## 2024-02-06 DIAGNOSIS — Z820 Family history of epilepsy and other diseases of the nervous system: Secondary | ICD-10-CM

## 2024-02-06 DIAGNOSIS — Z832 Family history of diseases of the blood and blood-forming organs and certain disorders involving the immune mechanism: Secondary | ICD-10-CM

## 2024-02-06 DIAGNOSIS — F419 Anxiety disorder, unspecified: Secondary | ICD-10-CM | POA: Diagnosis not present

## 2024-02-06 DIAGNOSIS — F3164 Bipolar disorder, current episode mixed, severe, with psychotic features: Secondary | ICD-10-CM | POA: Diagnosis not present

## 2024-02-06 LAB — URINE DRUG SCREEN, QUALITATIVE (ARMC ONLY)
Amphetamines, Ur Screen: NOT DETECTED
Barbiturates, Ur Screen: NOT DETECTED
Benzodiazepine, Ur Scrn: NOT DETECTED
Cannabinoid 50 Ng, Ur ~~LOC~~: POSITIVE — AB
Cocaine Metabolite,Ur ~~LOC~~: POSITIVE — AB
MDMA (Ecstasy)Ur Screen: NOT DETECTED
Methadone Scn, Ur: NOT DETECTED
Opiate, Ur Screen: NOT DETECTED
Phencyclidine (PCP) Ur S: NOT DETECTED
Tricyclic, Ur Screen: NOT DETECTED

## 2024-02-06 LAB — POC URINE PREG, ED: Preg Test, Ur: NEGATIVE

## 2024-02-06 MED ORDER — LORAZEPAM 2 MG/ML IJ SOLN: 2.0000 mg | Freq: Three times a day (TID) | INTRAMUSCULAR | Status: DC | PRN

## 2024-02-06 MED ORDER — ACETAMINOPHEN 325 MG PO TABS: 650.0000 mg | ORAL_TABLET | Freq: Four times a day (QID) | ORAL | Status: DC | PRN

## 2024-02-06 MED ORDER — MAGNESIUM HYDROXIDE 400 MG/5ML PO SUSP: 30.0000 mL | Freq: Every day | ORAL | Status: DC | PRN

## 2024-02-06 MED ORDER — TRAZODONE HCL 50 MG PO TABS
50.0000 mg | ORAL_TABLET | Freq: Every evening | ORAL | Status: DC | PRN
  Administered 2024-02-06 – 2024-02-11 (×6): 50 mg via ORAL
  Filled 2024-02-06 (×5): qty 1

## 2024-02-06 MED ORDER — HALOPERIDOL LACTATE 5 MG/ML IJ SOLN: 10.0000 mg | Freq: Three times a day (TID) | INTRAMUSCULAR | Status: DC | PRN

## 2024-02-06 MED ORDER — DIPHENHYDRAMINE HCL 50 MG/ML IJ SOLN
50.0000 mg | Freq: Three times a day (TID) | INTRAMUSCULAR | Status: DC | PRN
Start: 2024-02-06 — End: 2024-02-12

## 2024-02-06 MED ORDER — HALOPERIDOL LACTATE 5 MG/ML IJ SOLN
5.0000 mg | Freq: Three times a day (TID) | INTRAMUSCULAR | Status: DC | PRN
Start: 2024-02-06 — End: 2024-02-12

## 2024-02-06 MED ORDER — DIPHENHYDRAMINE HCL 50 MG/ML IJ SOLN: 50.0000 mg | Freq: Three times a day (TID) | INTRAMUSCULAR | Status: DC | PRN

## 2024-02-06 MED ORDER — HYDROXYZINE HCL 25 MG PO TABS
25.0000 mg | ORAL_TABLET | Freq: Three times a day (TID) | ORAL | Status: DC | PRN
  Administered 2024-02-06 – 2024-02-07 (×2): 25 mg via ORAL
  Filled 2024-02-06 (×2): qty 1

## 2024-02-06 MED ORDER — DIPHENHYDRAMINE HCL 25 MG PO CAPS: 50.0000 mg | ORAL_CAPSULE | Freq: Three times a day (TID) | ORAL | Status: DC | PRN

## 2024-02-06 MED ORDER — HALOPERIDOL 5 MG PO TABS
5.0000 mg | ORAL_TABLET | Freq: Three times a day (TID) | ORAL | Status: DC | PRN
Start: 2024-02-06 — End: 2024-02-12

## 2024-02-06 MED ORDER — ALUM & MAG HYDROXIDE-SIMETH 200-200-20 MG/5ML PO SUSP: 30.0000 mL | ORAL | Status: DC | PRN

## 2024-02-06 NOTE — ED Notes (Addendum)
 [facility] here to take patient to [facility]. signature for EMTALA transfer, rider waiver, and consent for treatment obtained prior to transfer. Pt alert and oriented X4, cooperative, RR even and unlabored, color WNL. Pt in NAD. 1 bag of belongings and transfer packet sent with [facility]

## 2024-02-06 NOTE — ED Notes (Signed)
 Pt awake asking for something to eat, given graham crackers

## 2024-02-06 NOTE — Tx Team (Signed)
 Initial Treatment Plan 02/06/2024 6:02 PM [name] [id number]    PATIENT STRESSORS: Financial difficulties   Health problems   Substance abuse   Traumatic event     PATIENT STRENGTHS: Ability for insight    PATIENT IDENTIFIED PROBLEMS: Substance use disorder  Depression  Anxiety  Hopelessness                DISCHARGE CRITERIA:  Ability to meet basic life and health needs Verbal commitment to aftercare and medication compliance Withdrawal symptoms are absent or subacute and managed without 24-hour nursing intervention  PRELIMINARY DISCHARGE PLAN: Outpatient therapy Return to previous living arrangement  PATIENT/FAMILY INVOLVEMENT: This treatment plan has been presented to and reviewed with the patient, Debbie [name],  has been given the opportunity to ask questions and make suggestions.  Melvenia Needles, RN 02/06/2024, 6:02 PM
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
## 2024-02-06 NOTE — ED Notes (Signed)
 VOL/ pending consult & assessment

## 2024-02-06 NOTE — Plan of Care (Signed)
   Problem: Education: Goal: Emotional status will improve Outcome: Progressing   Problem: Activity: Goal: Interest or engagement in activities will improve Outcome: Progressing

## 2024-02-06 NOTE — Plan of Care (Signed)
   Problem: Activity: Goal: Interest or engagement in activities will improve Outcome: Progressing   Problem: Coping: Goal: Ability to verbalize frustrations and anger appropriately will improve Outcome: Progressing   Problem: Safety: Goal: Periods of time without injury will increase Outcome: Progressing

## 2024-02-06 NOTE — BH Assessment (Signed)
 Patient has been accepted to [facility].  Patient assigned to room 406-1. Accepting physician is Dr. Abbott Pao.  Call report to [phone].  Representative was [employer].   ER Staff is aware of it: Misty Stanley, ER Secretary  Dr. Rosalia Hammers, ER MD  Bella Kennedy, Patient's Nurse

## 2024-02-06 NOTE — ED Provider Notes (Signed)
 [facility] Provider Note    Event Date/Time   First MD Initiated Contact with Patient 02/06/24 0106     (approximate)   History   Psychiatric Evaluation   HPI  Debbie [name] is a 28 y.o. female who presents to the ED from home voluntarily for behavioral medicine evaluation for SI without plan.  Patient states she was sober but relapsed and used cocaine last night as well as alcohol.  Voices SI without plan.  Denies HI/AH/VH.  Voices no medical complaints.  Eating graham crackers in no acute distress.  Denies chest pain, shortness of breath, abdominal pain, nausea, vomiting or dizziness.     Past Medical History   Past Medical History:  Diagnosis Date   Migraine    No pertinent past medical history    Urinary tract infection      Active Problem List   Patient Active Problem List   Diagnosis Date Noted   Contraception management 10/13/2014   Screen for STD (sexually transmitted disease) 10/13/2014   Menorrhagia 04/22/2013   Allergic contact dermatitis due to adhesives 03/11/2013   Family history of Duchenne muscle dystrophy 08/26/2012     Past Surgical History   Past Surgical History:  Procedure Laterality Date   APPENDECTOMY       Home Medications   Prior to Admission medications   Medication Sig Start Date End Date Taking? Authorizing Provider  amoxicillin (AMOXIL) 500 MG capsule Take 1 capsule (500 mg total) by mouth 3 (three) times daily. 10/08/23   Arthor Captain, PA-C  doxycycline (VIBRAMYCIN) 100 MG capsule Take 1 capsule (100 mg total) by mouth 2 (two) times daily. 10/08/23   Arthor Captain, PA-C  hydrOXYzine (ATARAX) 25 MG tablet Take 1 tablet (25 mg total) by mouth 3 (three) times daily as needed. 08/10/23   Marlou Sa, NP  metroNIDAZOLE (FLAGYL) 500 MG tablet Take 1 tablet (500 mg total) by mouth 2 (two) times daily. 09/10/23   Tegeler, Canary Brim, MD     Allergies  Clindamycin/lincomycin, Shellfish allergy,  and Nystatin   Family History   Family History  Problem Relation Age of Onset   Muscular dystrophy Cousin        Duchenne Muscular Dystrophy   Muscular dystrophy Cousin    Clotting disorder Mother        has a filter   Asthma Mother    Depression Mother    Hypertension Father      Physical Exam  Triage Vital Signs: ED Triage Vitals  Encounter Vitals Group     BP 02/05/24 2256 130/89     Systolic BP Percentile --      Diastolic BP Percentile --      Pulse Rate 02/05/24 2256 85     Resp 02/05/24 2256 18     Temp 02/05/24 2300 98 F (36.7 C)     Temp Source 02/05/24 2256 Oral     SpO2 02/05/24 2256 96 %     Weight 02/05/24 2256 120 lb (54.4 kg)     Height 02/05/24 2256 5\' 3"  (1.6 m)     Head Circumference --      Peak Flow --      Pain Score 02/05/24 2255 0     Pain Loc --      Pain Education --      Exclude from Growth Chart --     Updated Vital Signs: BP 130/89 (BP Location: Left Arm)   Pulse 85  Temp 97.8 F (36.6 C) (Oral)   Resp 18   Ht 5\' 3"  (1.6 m)   Wt 54.4 kg   LMP 01/22/2024 (Approximate)   SpO2 96%   BMI 21.26 kg/m    General: Awake, no distress.  CV:  RRR.  Good peripheral perfusion.  Resp:  Normal effort.  CTAB. Abd:  Nontender.  No distention.  Other:  Cooperative.  Alert and oriented x 3.  CN II-XII grossly intact.  5/5 motor strength and sensation all extremities.  M AE x 4.   ED Results / Procedures / Treatments  Labs (all labs ordered are listed, but only abnormal results are displayed) Labs Reviewed  COMPREHENSIVE METABOLIC PANEL - Abnormal; Notable for the following components:      Result Value   Glucose, Bld 117 (*)    All other components within normal limits  SALICYLATE LEVEL - Abnormal; Notable for the following components:   Salicylate Lvl <7.0 (*)    All other components within normal limits  ACETAMINOPHEN LEVEL - Abnormal; Notable for the following components:   Acetaminophen (Tylenol), Serum <10 (*)    All other  components within normal limits  CBC - Abnormal; Notable for the following components:   Hemoglobin 11.7 (*)    Platelets 423 (*)    All other components within normal limits  ETHANOL  URINE DRUG SCREEN, QUALITATIVE ([facility] ONLY)  POC URINE PREG, ED     EKG  None   RADIOLOGY None   Official radiology report(s): No results found.   PROCEDURES:  Critical Care performed: No  Procedures   MEDICATIONS ORDERED IN ED: Medications - No data to display   IMPRESSION / MDM / ASSESSMENT AND PLAN / ED COURSE  I reviewed the triage vital signs and the nursing notes.                             28 year old female presenting with drug use and suicidal ideation without plan.  Laboratory results unremarkable.  Patient contracts for safety in the emergency department.  Patient is medically cleared pending psychiatric consultation.  Patient's presentation is most consistent with exacerbation of chronic illness.  The patient has been placed in psychiatric observation due to the need to provide a safe environment for the patient while obtaining psychiatric consultation and evaluation, as well as ongoing medical and medication management to treat the patient's condition.  The patient has not been placed under full IVC at this time.       FINAL CLINICAL IMPRESSION(S) / ED DIAGNOSES   Final diagnoses:  Suicidal ideation  Polysubstance abuse (HCC)     Rx / DC Orders   ED Discharge Orders     None        Note:  This document was prepared using Dragon voice recognition software and may include unintentional dictation errors.   Irean Hong, MD 02/06/24 [phone]

## 2024-02-06 NOTE — ED Notes (Signed)
Safe  transport  called  for  transport  to  [facility]

## 2024-02-07 DIAGNOSIS — F3163 Bipolar disorder, current episode mixed, severe, without psychotic features: Secondary | ICD-10-CM | POA: Diagnosis not present

## 2024-02-07 LAB — LIPID PANEL
Cholesterol: 141 mg/dL (ref 0–200)
HDL: 61 mg/dL (ref >40)
LDL Cholesterol: 57 mg/dL (ref 0–99)
Total CHOL/HDL Ratio: 2.3 ratio
Triglycerides: 116 mg/dL (ref <150)
VLDL: 23 mg/dL (ref 0–40)

## 2024-02-07 LAB — HEMOGLOBIN A1C
Hgb A1c MFr Bld: 5.8 % — ABNORMAL HIGH (ref 4.8–5.6)
Mean Plasma Glucose: 119.76 mg/dL

## 2024-02-07 MED ORDER — WHITE PETROLATUM EX OINT
TOPICAL_OINTMENT | CUTANEOUS | Status: AC
  Administered 2024-02-07: 1
  Filled 2024-02-07: qty 5

## 2024-02-07 MED ORDER — QUETIAPINE FUMARATE 200 MG PO TABS
200.0000 mg | ORAL_TABLET | Freq: Every day | ORAL | Status: DC
  Administered 2024-02-07 – 2024-02-09 (×3): 200 mg via ORAL
  Filled 2024-02-07 (×6): qty 1

## 2024-02-07 MED ORDER — QUETIAPINE FUMARATE 25 MG PO TABS
25.0000 mg | ORAL_TABLET | Freq: Two times a day (BID) | ORAL | Status: DC
  Administered 2024-02-07 – 2024-02-10 (×6): 25 mg via ORAL
  Filled 2024-02-07 (×11): qty 1

## 2024-02-07 NOTE — [facility] Group Notes (Signed)
 [facility] Group Notes:  (Nursing/MHT/Case Management/Adjunct)  Date:  02/07/2024  Time:  2:41 AM  Type of Therapy:   NA Group  Participation Level:  Active  Participation Quality:  Appropriate  Affect:  Appropriate  Cognitive:  Appropriate  Insight:  Appropriate  Engagement in Group:  Engaged  Modes of Intervention:  Education  Summary of Progress/Problems: Attended NA meeting.  Debbie [name] 02/07/2024, 2:41 AM

## 2024-02-07 NOTE — Group Note (Signed)
 LCSW Group Therapy Note   Group Date: 02/07/2024 Start Time: 1100 End Time: 1200  Participation:  patient was present for the second half of the group session  Type of Therapy:  Group Therapy  Title:  Healing From Within: Understanding Our Past, Building Our Future  Objective:  To help participants understand the impact of early experiences on mental and physical health, with a focus on Adverse Childhood Experiences (ACEs), and to explore ways to build resilience and healing.  Group Goals: Understand ACEs and Their Impact: Learn how childhood experiences shape mental and physical health. Build Resilience: Develop strategies for overcoming challenges and creating positive change. Promote Healing: Recognize the value of support and the possibility of healing through therapy and self-care.  Summary: In today's session, we discussed how early experiences, especially ACEs, impact mental and physical health. We explored the effects of stress, abuse, and neglect on brain development and well-being. The group focused on resilience, understanding that healing and positive change are possible with support and self-awareness.  Therapeutic Modalities Used: Psychoeducation: Sharing information about ACEs and their effects. Cognitive Behavioral Therapy (CBT): Helping reframe negative thought patterns. Trauma-Informed Therapy: Creating a safe, supportive space for healing.    Alla Feeling, LCSWA 02/07/2024  6:05 PM

## 2024-02-07 NOTE — [facility] Suicide Risk Assessment (Cosign Needed Addendum)
 [facility] Admission Suicide Risk Assessment   Nursing information obtained from:  Patient Demographic factors:  NA Current Mental Status:  NA Loss Factors:  NA Historical Factors:  NA Risk Reduction Factors:  NA  Total Time spent with patient:  75 mins Principal Problem: Bipolar disorder (HCC) Diagnosis:  Principal Problem:   Bipolar disorder (HCC)  Subjective Data: Debbie [name] is a 28 year old women with a psychiatric diagnosis of bipolar disorder. Denies medical history. She presents to [facility] voluntarily from [facility] for SI without plan. She was medically cleared and transferred to this facility via safe transport.   Continued Clinical Symptoms:  Alcohol Use Disorder Identification Test Final Score (AUDIT): 21 The "Alcohol Use Disorders Identification Test", Guidelines for Use in Primary Care, Second Edition.  World Science writer [facility]). Score between 0-7:  no or low risk or alcohol related problems. Score between 8-15:  moderate risk of alcohol related problems. Score between 16-19:  high risk of alcohol related problems. Score 20 or above:  warrants further diagnostic evaluation for alcohol dependence and treatment.  CLINICAL FACTORS:   Severe Anxiety and/or Agitation Bipolar Disorder:   Mixed State Alcohol/Substance Abuse/Dependencies Unstable or Poor Therapeutic Relationship Previous Psychiatric Diagnoses and Treatments  Musculoskeletal: Strength & Muscle Tone: within normal limits Gait & Station: normal Patient leans: N/A  Psychiatric Specialty Exam:  Presentation  General Appearance:  Disheveled  Eye Contact: Poor  Speech: Slurred; Clear and Coherent (Changed throughout interview.)  Speech Volume: Decreased (Overall decreased with few sentences increased.)  Handedness: Right  Mood and Affect  Mood: Irritable; Labile; Angry  Affect: Congruent; Labile  Thought Process  Thought Processes: Disorganized  Descriptions of  Associations:Intact  Orientation:Full (Time, Place and Person)  Thought Content:Illogical  History of Schizophrenia/Schizoaffective disorder:No  Duration of Psychotic Symptoms:No data recorded Hallucinations:Hallucinations: Other (comment) (Patient did not answer interviewer.)  Ideas of Reference:None  Suicidal Thoughts:Suicidal Thoughts: -- (Patient did not answer interviewer.)  Homicidal Thoughts:Homicidal Thoughts: -- (Patient did not answer interviewer.)  Sensorium  Memory: Other (comment) (Unclear if memory is poor, or pt unwilling to answer questions.)  Judgment: Impaired  Insight: Lacking  Executive Functions  Concentration: Fair  Attention Span: Fair  Recall: [employer] of Knowledge: Fair  Language: Fair  Psychomotor Activity  Psychomotor Activity: Psychomotor Activity: Increased  Assets  Assets: Social Support; Housing  Sleep  Sleep: Sleep: -- (Patient did not answer interviewer.) Number of Hours of Sleep: 0 (Patient did not answer interviewer.)  Physical Exam: Physical Exam Vitals and nursing note reviewed.  Constitutional:      General: She is not in acute distress.    Appearance: She is not ill-appearing, toxic-appearing or diaphoretic.  HENT:     Head: Normocephalic and atraumatic.     Right Ear: External ear normal.     Left Ear: External ear normal.     Nose: Nose normal. No rhinorrhea.     Mouth/Throat:     Mouth: Mucous membranes are moist.     Pharynx: Oropharynx is clear.  Eyes:     General:        Right eye: No discharge.        Left eye: No discharge.     Extraocular Movements: Extraocular movements intact.     Pupils: Pupils are equal, round, and reactive to light.  Cardiovascular:     Rate and Rhythm: Normal rate.     Pulses: Normal pulses.  Pulmonary:     Effort: Pulmonary effort is normal. No  respiratory distress.  Abdominal:     Comments: Deferred  Genitourinary:    Comments: Deferred Musculoskeletal:         General: No deformity or signs of injury. Normal range of motion.     Cervical back: Normal range of motion. No rigidity.  Skin:    Coloration: Skin is not jaundiced or pale.  Neurological:     General: No focal deficit present.     Mental Status: She is alert and oriented to person, place, and time.     Motor: No weakness.     Coordination: Coordination normal.     Gait: Gait normal.  Psychiatric:        Attention and Perception: She is inattentive.        Mood and Affect: Mood is anxious and depressed. Affect is blunt, flat and angry.        Speech: She is noncommunicative (Limited communication/responses.). Speech is delayed.        Behavior: Behavior is uncooperative, agitated and aggressive.        Thought Content: Thought content includes suicidal ideation. Thought content does not include suicidal plan.        Cognition and Memory: Cognition is impaired. Memory is impaired.        Judgment: Judgment is impulsive.   Review of Systems  Constitutional:  Negative for chills, diaphoresis, fever and malaise/fatigue.  HENT:  Negative for congestion, ear discharge and nosebleeds.   Eyes:  Negative for photophobia, pain, discharge and redness.  Respiratory:  Negative for cough and shortness of breath.   Cardiovascular:  Negative for chest pain and palpitations.  Gastrointestinal:  Negative for abdominal pain, heartburn, nausea and vomiting.  Genitourinary:  Negative for dysuria, frequency and urgency.  Musculoskeletal:  Negative for falls and myalgias.  Skin:  Negative for itching.  Neurological:  Negative for dizziness, speech change, focal weakness, loss of consciousness and weakness.  Endo/Heme/Allergies:        See allergy list   Psychiatric/Behavioral:  Positive for depression, substance abuse and suicidal ideas. The patient is nervous/anxious.    Blood pressure 101/63, pulse 61, temperature 97.7 F (36.5 C), temperature source Oral, resp. rate 17, height 5\' 3"  (1.6 m), weight  59.9 kg, last menstrual period 01/22/2024, SpO2 100%. Body mass index is 23.38 kg/m.  COGNITIVE FEATURES THAT CONTRIBUTE TO RISK:  Loss of executive function    SUICIDE RISK:   Mild:  Suicidal ideation of limited frequency, intensity, duration, and specificity.  There are no identifiable plans, no associated intent, mild dysphoria and related symptoms, good self-control (both objective and subjective assessment), few other risk factors, and identifiable protective factors, including available and accessible social support.  PLAN OF CARE: Treatment Plan Summary: Daily contact with patient to assess and evaluate symptoms and progress in treatment and Medication management   Physician Treatment Plan for Primary Diagnosis:  Assessment: Mystic Labo is a 29 year old women with a psychiatric diagnosis of bipolar disorder. Denies medical history. She presents to [facility] voluntarily from [facility] for SI without plan. She was medically cleared and transferred to this facility via safe transport.    Principal Diagnosis: Bipolar disorder (HCC) Diagnosis:  Principal Problem:   Bipolar disorder (HCC)   Plan: Medications: -- Initiate Seroquel 200 mg po at bedtime  -- Initiate Seroquel 25 mg po BID -- Continue Trazodone 50 mg po at bedtime prn sleep -- Continue Atarax 25 mg po TID prn anxiety   Other PRN meds: --  Continue Tylenol 650 mg po q6h prn pain -- Continue Maalox 30 ml po q4h prn indigestion -- Continue Magnesium hydroxide 30 ml po every day prn constipation   PRN CIWA protocol initiated see MAR.   Agitation protocol: -- Benadryl capsule 50 mg p.o. or IM 3 times daily as needed agitation   -- Haldol tablets 5 mg po IM 3 times daily as needed agitation   -- Lorazepam tablet 2 mg p.o. or IM 3 times daily as needed agitation   --The risks/benefits/side-effects/alternatives to this medication were discussed in detail with the patient and time was given for questions. The  patient consents to medication trial.  -- Metabolic profile and EKG monitoring obtained while on an atypical antipsychotic (BMI: Lipid Panel: HbgA1c: QTc:)  -- Encouraged patient to participate in unit milieu and in scheduled group therapies    Safety and Monitoring: - Voluntary admission to inpatient psychiatric unit for safety, stabilization and treatment - Daily contact with patient to assess and evaluate symptoms and progress in treatment - Patient's case to be discussed in multi-disciplinary team meeting - Observation Level : q15 minute checks - Vital signs: q12 hours - Precautions: suicide, but pt currently verbally contracts for safety on unit    Discharge Planning: - Social work and case management to assist with discharge planning and identification of hospital follow-up needs prior to discharge - Estimated LOS: 5-7 days - Discharge Concerns: Need to establish a safety plan; Medication compliance and effectiveness - Discharge Goals: Return home with outpatient referrals for mental health follow-up including medication management/psychotherapy.   Admission labs reviewed and significant for: Glucose 117 high, HGB 11.7 low, Platelets 423 high, UDS Cocaine (+) Cannabinoid (+)   New labs ordered: --Hemoglobin A1c --Lipid panel now -- Baseline ECG ordered   Physician Treatment Plan for Primary Diagnosis: Bipolar disorder (HCC) Long Term Goal(s): Improvement in symptoms so as ready for discharge   Short Term Goals: Ability to identify changes in lifestyle to reduce recurrence of condition will improve, Ability to verbalize feelings will improve, Ability to disclose and discuss suicidal ideas, Ability to demonstrate self-control will improve, Ability to identify and develop effective coping behaviors will improve, Ability to maintain clinical measurements within normal limits will improve, Compliance with prescribed medications will improve, and Ability to identify triggers associated  with substance abuse/mental health issues will improve  I certify that inpatient services furnished can reasonably be expected to improve the patient's condition.   Alan Mulder, FNP & Arn Medal, NP-Student  02/07/2024, 2:47 PM

## 2024-02-07 NOTE — Plan of Care (Signed)
   Problem: Education: Goal: Emotional status will improve Outcome: Progressing   Problem: Activity: Goal: Interest or engagement in activities will improve Outcome: Progressing   Problem: Safety: Goal: Periods of time without injury will increase Outcome: Progressing

## 2024-02-07 NOTE — H&P (Cosign Needed Addendum)
 Psychiatric Admission Assessment Adult  Patient Identification: Debbie [name] MRN:  [id number] Date of Evaluation:  02/07/2024 Chief Complaint:  Bipolar disorder Debbie [name]) [F31.9] Principal Diagnosis: Bipolar disorder (HCC) Diagnosis:  Principal Problem:   Bipolar disorder (HCC)  CC: "My sister made me come here."  History of Present Illness: Debbie [name] is a 28 year old women with a psychiatric diagnosis of bipolar disorder. Denies medical history. She presents to [name] voluntarily from [name] for SI without plan. She was medically cleared and transferred to this facility via safe transport.   This H&P is limited due to patient being uncooperative. Debbie [name] gave verbal permission to contact sister, Debbie [name]. Collateral information gathering was unsuccessful, sent to voicemail. No message was left to protect HIPAA.    Objective: This assessment was completed in hallway sitting upright on bench. Debbie [name] is alert and oriented to person, and place. Her appearance is disheveled with poor eye contact. Her speech was at times clear and coherent, other times slurred/mumbling/incomprehensible. The overall volume was decreased though at times of agitation it was increased. Her mood presents irritable, angry, and labile. She reported Depression 5/10, was unwilling to answer her Anxiety level today.  She denies SI/HI/AVH today. Her last SI was yesterday, she has no plans. Debbie [name]'s thought process is disorganized and illogical. It is unclear if Debbie [name]'s memory is impaired or if she is unwilling to respond. Her judgement is impaired and lacks insight. She did not answer how many hours of sleep she had last night.   Debbie [name] did reveal limited information including: Psych Hx: Depression, ADHD, Bipolar, PTSD Psych meds: Cymbalta, Seroqual, Wellbutrin, Gabapentin Inpatient encounters: - First: At 28 years old, [name] for SA taking pills. - Second: 2024, [name], SA did not explain method of  attempt. Outpatient therapy: Denies Medical Hx: Denies Family Hx: Mother has multiple unknown diagnosis.  Social Hx: Debbie [name] is currently single with four kids age 4, 51, 53 (twins), 22 years old. She reports herself and the kids live with her sister. Chart review shows at some point the children lived with their grandparents. 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 She has a legal case on 02/24/2024 for disorderly conduct, she would not explain further. She denies Administrator, sports. She denies access to guns/weapons in the home. She reports drinking 12 beers last week, smokes cigarettes 1 ppd, and she vapes (would not explain frequency or quantity). She stated that she last used marijuana last week, would not explain frequency or quantity. She reports using 1/2 gram cocaine in the last week. She last used suboxone strips yesterday.  Goals: "Just get back on my medicine."  Chart review reveals multiple encounters for psychiatric concerns including, substance use, severe depression, and SI/SA. Prior suicide attempts includes substance overdose and jumping from bridge (was stopped by a stranger).   Chart reviewed and findings shared with the treatment team and consult with attending psychiatrist. Vital signs reviewed without critical values. Lab and EKG reviewed as indicated in the treatment plan. Patient is admitted for mood stabilization, safety, and medication management. Yesterday pt required prn atarax and trazodone. During this assessment patient was not showing signs or symptoms of alcohol withdrawal including shaking, tremors, or sweating.   Mode of transport to [name]: Safe transport Current Outpatient (Home) Medication List: Reports Cymbalta, Seroquel, Wellbutrin, Gabapentin.   Collateral Information: Verbal consent by Debbie [name] to contact sister Debbie [name] was obtained. Attempt to call sister, was sent to voicemail. No message left  to protect HIPAA.  POA/Legal Guardian:  Patient is his own legal guardian.   Past Psychiatric Hx: Previous Psych Diagnoses: Depression, ADHD, Bipolar, PTSD Prior inpatient treatment:  -  [name] for SA OD on pills at 28 years old.  [name] in 2024 for SA. Did not explain the method used.  Current/prior outpatient treatment: Patient did not respond to question. Prior rehab hx: Patient did not respond to question. History of suicidal ideation: Patient did not respond to question. Chart review shows SI in May 2025 at De[name], and Jan 2024 at [name]. History of homicide or aggression: Patient did not respond to question. Psychiatric medication history: Reports Cymbalta, Seroquel, Wellbutrin, Gabapentin. Psychiatric medication compliance history: Stopped taking medications.  Neuromodulation history: Patient did not respond to question. Current Psychiatrist: Denies. Current therapist: Denies.   Substance Abuse Hx: Pt would not answer most questions. The limited responses includes: Alcohol: 12 beers last week. Tobacco: Daily consumption of nicotine both cigarettes 1 ppd, and vape forms. Education and resources were provided on the health affects of nicotine including mental health implications, as well as  benefits of cessation. Nicotine patch offered.  Illicit drugs: Reports 1/2 gram cocaine in the last week. Last used marijuana last week.  Rx drug abuse: Last used suboxone yesterday.  Rehab hx: Patient did not respond to question.  Past Medical History: Medical Diagnoses: Patient did not respond to question. Home Rx: Patient did not respond to question. Prior Hosp: Patient did not respond to question. Prior Surgeries/Trauma: Patient did not respond to question. Head trauma, LOC, concussions, seizures: Patient did not respond to question. Allergies: Patient did not respond to question. PCP: Patient did not respond to question.  Family History: Medical: Patient did not respond to question. Psych: Patient did not  respond to question. Psych Rx: Patient did not respond to question. SA/HA: Patient did not respond to question. Substance use family hx: Patient did not respond to question.  Social History:  Teresina is a mother of four ages 82, 34, 44 (twins), and 29 years old. 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 She and her kids live with her sister.   Associated Signs/Symptoms: Depression Symptoms:  depressed mood, suicidal thoughts without plan, anxiety, (Hypo) Manic Symptoms:  Impulsivity, Irritable Mood, Anxiety Symptoms:  Excessive Worry, Psychotic Symptoms:   Patient did not respond to question. Does not appear to be responding to internal stimuli.  PTSD Symptoms: Patient did not respond to question. Total Time spent with patient:  75 mins  Is the patient at risk to self? Yes.    Has the patient been a risk to self in the past 6 months? Unknown. Patient did not respond to question. Has the patient been a risk to self within the distant past? Yes.    Is the patient a risk to others? No.  Has the patient been a risk to others in the past 6 months?  Unknown. Patient did not respond to question. Has the patient been a risk to others within the distant past?  Unknown. Patient did not respond to question.  [place] Scale:  Flowsheet Row Ad[facility] (Current) [facility] Most recent reading at 02/06/2024  5:41 PM ED from 02/06/2024 in [name] Most recent reading at 02/05/2024 10:58 PM ED from 10/08/2023 in [facility] Department at [facility] Most recent reading at 10/08/2023  1:01 PM  C-SSRS RISK CATEGORY Low Risk  Low Risk No Risk       Prior Inpatient Therapy: Yes.   If yes, describe: [place] [name] at age 61 years old for OD attempt taking pills. Also at [facility] in 2024 for SA would not describe chosen method.  Prior Outpatient Therapy: Unknown. Patient did not  respond to question.  Alcohol Screening: 1. How often do you have a drink containing alcohol?: 2 to 3 times a week 2. How many drinks containing alcohol do you have on a typical day when you are drinking?: 5 or 6 3. How often do you have six or more drinks on one occasion?: Daily or almost daily AUDIT-C Score: 9 4. How often during the last year have you found that you were not able to stop drinking once you had started?: Daily or almost daily 5. How often during the last year have you failed to do what was normally expected from you because of drinking?: Daily or almost daily 6. How often during the last year have you needed a first drink in the morning to get yourself going after a heavy drinking session?: Daily or almost daily 7. How often during the last year have you had a feeling of guilt of remorse after drinking?: Never 8. How often during the last year have you been unable to remember what happened the night before because you had been drinking?: Never 9. Have you or someone else been injured as a result of your drinking?: No 10. Has a relative or friend or a doctor or another health worker been concerned about your drinking or suggested you cut down?: No Alcohol Use Disorder Identification Test Final Score (AUDIT): 21 Alcohol Brief Interventions/Follow-up: Alcohol education/Brief advice Substance Abuse History in the last 12 months:  Yes.   Consequences of Substance Abuse: Family Consequences:  Verbal report suggested substance use was a reason for sister facilitating patient seeking care.  Previous Psychotropic Medications: Yes  Psychological Evaluations: Yes  Past Medical History:  Past Medical History:  Diagnosis Date   Migraine    No pertinent past medical history    Urinary tract infection     Past Surgical History:  Procedure Laterality Date   APPENDECTOMY     Family History:  Family History  Problem Relation Age of Onset   Muscular dystrophy Cousin        D[place]henne  Muscular Dystrophy   Muscular dystrophy Cousin    Clotting disorder Mother        has a filter   Asthma Mother    Depression Mother    Hypertension Father    Tobacco Screening:  Social History   Tobacco Use  Smoking Status Some Days   Current packs/day: 1.00   Types: Cigarettes  Smokeless Tobacco Never    BH Tobacco Counseling     Are you interested in Tobacco Cessation Medications?  No, patient refused Counseled patient on smoking cessation:  No value filed. Reason Tobacco Screening Not Completed: No value filed.      Social History:  Social History   Substance and Sexual Activity  Alcohol Use No   Alcohol/week: 0.0 standard drinks of alcohol   Comment: social     Social History   Substance and Sexual Activity  Drug Use Yes   Types: Cocaine, Marijuana   Comment: fentanyl    Additional Social History:   Allergies:   Allergies  Allergen Reactions   Clindamycin/Lincomycin    Shellfish Allergy Swelling   Nystatin Rash   Lab Results:  Results for orders placed or performed during the [name] encounter of 02/06/24 (from the past 48 hours)  Comprehensive metabolic panel     Status: Abnormal   Collection Time: 02/05/24 10:58 PM  Result Value Ref Range   Sodium 138 135 - 145 mmol/L   Potassium 4.3 3.5 - 5.1 mmol/L   Chloride 106 98 - 111 mmol/L   CO2 26 22 - 32 mmol/L   Glucose, Bld 117 (H) 70 - 99 mg/dL    Comment: Glucose reference range applies only to samples taken after fasting for at least 8 hours.   BUN 11 6 - 20 mg/dL   Creatinine, Ser 9.56 0.44 - 1.00 mg/dL   Calcium 9.3 8.9 - 21.3 mg/dL   Total Protein 7.9 6.5 - 8.1 g/dL   Albumin 4.0 3.5 - 5.0 g/dL   AST 21 15 - 41 U/L   ALT 15 0 - 44 U/L   Alkaline Phosphatase 78 38 - 126 U/L   Total Bilirubin 0.2 0.0 - 1.2 mg/dL   GFR, Estimated >08 >65 mL/min    Comment: (NOTE) Calculated using the CKD-EPI Creatinine Equation (2021)    Anion gap 6 5 - 15    Comment: Performed at [facility],  [address] [place], [place] [address]  Ethanol     Status: None   Collection Time: 02/05/24 10:58 PM  Result Value Ref Range   Alcohol, Ethyl (B) <10 <10 mg/dL    Comment: (NOTE) Lowest detectable limit for serum alcohol is 10 mg/dL.  For medical purposes only. Performed at [facility], [address] [place], [place] [address]   Salicylate level     Status: Abnormal   Collection Time: 02/05/24 10:58 PM  Result Value Ref Range   Salicylate Lvl <7.0 (L) 7.0 - 30.0 mg/dL    Comment: Performed at [facility], [address] [place], [place] [address]  Acetaminophen level     Status: Abnormal   Collection Time: 02/05/24 10:58 PM  Result Value Ref Range   Acetaminophen (Tylenol), Serum <10 (L) 10 - 30 ug/mL    Comment: (NOTE) Therapeutic concentrations vary significantly. A range of 10-30 ug/mL  may be an effective concentration for many patients. However, some  are best treated at concentrations outside of this range. Acetaminophen concentrations >150 ug/mL at 4 hours after ingestion  and >50 ug/mL at 12 hours after ingestion are often associated with  toxic reactions.  Performed at [facility], [address] [place], [place] [address]   cbc     Status: Abnormal   Collection Time: 02/05/24 10:58 PM  Result Value Ref Range   WBC 4.7 4.0 - 10.5 K/uL   RBC 4.40 3.87 - 5.11 MIL/uL   Hemoglobin 11.7 (L) 12.0 - 15.0 g/dL   HCT 27.2 53.6 - 64.4 %   MCV 85.2 80.0 - 100.0 fL   MCH 26.6 26.0 - 34.0 pg   MCHC 31.2 30.0 - 36.0 g/dL   RDW 03.4 74.2 - 59.5 %   Platelets 423 (H) 150 - 400 K/uL   nRBC 0.0 0.0 - 0.2 %    Comment: Performed at [facility], [address], [place], [place] [address]  Urine Drug Screen, Qualitative     Status: Abnormal   Collection Time: 02/06/24 12:47 PM  Result Value Ref Range   Tricyclic, Ur Screen NONE DETECTED NONE DETECTED   Amphetamines, Ur Screen NONE DETECTED NONE DETECTED   MDMA (Ecstasy)Ur  Screen NONE  DETECTED NONE DETECTED   Cocaine Metabolite,Ur [place] POSITIVE (A) NONE DETECTED   Opiate, Ur Screen NONE DETECTED NONE DETECTED   Phencyclidine (PCP) Ur S NONE DETECTED NONE DETECTED   Cannabinoid 50 Ng, Ur [place] POSITIVE (A) NONE DETECTED   Barbiturates, Ur Screen NONE DETECTED NONE DETECTED   Benzodiazepine, Ur Scrn NONE DETECTED NONE DETECTED   Methadone Scn, Ur NONE DETECTED NONE DETECTED    Comment: (NOTE) Tricyclics + metabolites, urine    Cutoff 1000 ng/mL Amphetamines + metabolites, urine  Cutoff 1000 ng/mL MDMA (Ecstasy), urine              Cutoff 500 ng/mL Cocaine Metabolite, urine          Cutoff 300 ng/mL Opiate + metabolites, urine        Cutoff 300 ng/mL Phencyclidine (PCP), urine         Cutoff 25 ng/mL Cannabinoid, urine                 Cutoff 50 ng/mL Barbiturates + metabolites, urine  Cutoff 200 ng/mL Benzodiazepine, urine              Cutoff 200 ng/mL Methadone, urine                   Cutoff 300 ng/mL  The urine drug screen provides only a preliminary, unconfirmed analytical test result and should not be used for non-medical purposes. Clinical consideration and professional judgment should be applied to any positive drug screen result due to possible interfering substances. A more specific alternate chemical method must be used in order to obtain a confirmed analytical result. Gas chromatography / mass spectrometry (GC/MS) is the preferred confirm atory method. Performed at [facility], [address] [place], [place] [address]   POC urine preg, ED     Status: None   Collection Time: 02/06/24 12:51 PM  Result Value Ref Range   Preg Test, Ur Negative Negative   Blood Alcohol level:  Lab Results  Component Value Date   ETH <10 02/05/2024   Metabolic Disorder Labs:  No results found for: "HGBA1C", "MPG" No results found for: "PROLACTIN" No results found for: "CHOL", "TRIG", "HDL", "CHOLHDL", "VLDL", "LDLCALC"  Current  Medications: Current Facility-Administered Medications  Medication Dose Route Frequency Provider Last Rate Last Admin   acetaminophen (TYLENOL) tablet 650 mg  650 mg Oral Q6H PRN Lauree Chandler, NP       alum & mag hydroxide-simeth (MAALOX/MYLANTA) 200-200-20 MG/5ML suspension 30 mL  30 mL Oral Q4H PRN Lauree Chandler, NP       haloperidol (HALDOL) tablet 5 mg  5 mg Oral TID PRN Lauree Chandler, NP       And   diphenhydrAMINE (BENADRYL) capsule 50 mg  50 mg Oral TID PRN Lauree Chandler, NP       haloperidol lactate (HALDOL) injection 5 mg  5 mg Intramuscular TID PRN Lauree Chandler, NP       And   diphenhydrAMINE (BENADRYL) injection 50 mg  50 mg Intramuscular TID PRN Lauree Chandler, NP       And   LORazepam (ATIVAN) injection 2 mg  2 mg Intramuscular TID PRN Lauree Chandler, NP       haloperidol lactate (HALDOL) injection 10 mg  10 mg Intramuscular TID PRN Lauree Chandler, NP       And   diphenhydrAMINE (BENADRYL) injection 50 mg  50 mg Intramuscular TID PRN Lauree Chandler,  NP       And   LORazepam (ATIVAN) injection 2 mg  2 mg Intramuscular TID PRN Lauree Chandler, NP       hydrOXYzine (ATARAX) tablet 25 mg  25 mg Oral TID PRN Lauree Chandler, NP   25 mg at 02/07/24 1026   magnesium hydroxide (MILK OF MAGNESIA) suspension 30 mL  30 mL Oral Daily PRN Lauree Chandler, NP       traZODone (DESYREL) tablet 50 mg  50 mg Oral QHS PRN Lauree Chandler, NP   50 mg at 02/06/24 2115   PTA Medications: Medications Prior to Admission  Medication Sig Dispense Refill Last Dose/Taking   amoxicillin (AMOXIL) 500 MG capsule Take 1 capsule (500 mg total) by mouth 3 (three) times daily. (Patient not taking: Reported on 02/06/2024) 21 capsule 0    doxycycline (VIBRAMYCIN) 100 MG capsule Take 1 capsule (100 mg total) by mouth 2 (two) times daily. (Patient not taking: Reported on 02/06/2024) 14 capsule 0    hydrOXYzine (ATARAX) 25 MG tablet Take 1 tablet (25  mg total) by mouth 3 (three) times daily as needed. (Patient not taking: Reported on 02/06/2024) 30 tablet 0    metroNIDAZOLE (FLAGYL) 500 MG tablet Take 1 tablet (500 mg total) by mouth 2 (two) times daily. (Patient not taking: Reported on 02/06/2024) 14 tablet 0    QUEtiapine (SEROQUEL) 200 MG tablet Take 200 mg by mouth at bedtime. (Patient not taking: Reported on 02/06/2024)      QUEtiapine (SEROQUEL) 25 MG tablet Take 25 mg by mouth 2 (two) times daily. (Patient not taking: Reported on 02/06/2024)      Musculoskeletal: Strength & Muscle Tone: within normal limits Gait & Station: normal Patient leans: N/A  Psychiatric Specialty Exam:  Presentation  General Appearance:  Disheveled  Eye Contact: Poor  Speech: Slurred; Clear and Coherent (Changed throughout interview.)  Speech Volume: Decreased (Overall decreased with few sentences increased.)  Handedness: Right  Mood and Affect  Mood: Irritable; Labile; Angry  Affect: Congruent; Labile  Thought Process  Thought Processes: Disorganized  Duration of Psychotic Symptoms: Unknown. Patient did not respond to question. She does not appear to be responding to internal stimuli.  Past Diagnosis of Schizophrenia or Psychoactive disorder: No  Descriptions of Associations:Intact  Orientation:Full (Time, Place and Person)  Thought Content:Illogical  Hallucinations:Hallucinations: Other (comment) (Patient did not answer interviewer.)  Ideas of Reference:None  Suicidal Thoughts:Suicidal Thoughts: -- (Patient did not answer interviewer.)  Homicidal Thoughts:Homicidal Thoughts: -- (Patient did not answer interviewer.)  Sensorium  Memory: Other (comment) (Unclear if memory is poor, or pt unwilling to answer questions.)  Judgment: Impaired  Insight: Lacking  Executive Functions  Concentration: Fair  Attention Span: Fair  Recall: [employer] of Knowledge: Fair  Language: Fair  Psychomotor Activity   Psychomotor Activity: Psychomotor Activity: Increased  Assets  Assets: Social Support; Housing  Sleep  Sleep: Sleep: -- (Patient did not answer interviewer.) Number of Hours of Sleep: 0 (Patient did not answer interviewer.)  Physical Exam: Physical Exam Vitals and nursing note reviewed.  Constitutional:      General: She is not in acute distress.    Appearance: She is not ill-appearing, toxic-appearing or diaphoretic.  HENT:     Head: Normocephalic and atraumatic.     Right Ear: External ear normal.     Left Ear: External ear normal.     Nose: Nose normal. No rhinorrhea.     Mouth/Throat:     Mouth: Mucous  membranes are moist.     Pharynx: Oropharynx is clear.  Eyes:     General:        Right eye: No discharge.        Left eye: No discharge.     Extraocular Movements: Extraocular movements intact.     Pupils: Pupils are equal, round, and reactive to light.  Cardiovascular:     Rate and Rhythm: Normal rate.     Pulses: Normal pulses.  Pulmonary:     Effort: Pulmonary effort is normal. No respiratory distress.  Abdominal:     Comments: Deferred  Genitourinary:    Comments: Deferred Musculoskeletal:        General: No deformity or signs of injury. Normal range of motion.     Cervical back: Normal range of motion. No rigidity.  Skin:    Coloration: Skin is not jaundiced or pale.  Neurological:     General: No focal deficit present.     Mental Status: She is alert and oriented to person, place, and time.     Motor: No weakness.     Coordination: Coordination normal.     Gait: Gait normal.  Psychiatric:        Attention and Perception: She is inattentive.        Mood and Affect: Mood is anxious and depressed. Affect is blunt, flat and angry.        Speech: She is noncommunicative. Speech is delayed.        Behavior: Behavior is uncooperative, agitated and aggressive.        Thought Content: Thought content includes suicidal ideation. Thought content does not  include suicidal plan.        Cognition and Memory: Cognition is impaired. Memory is impaired.        Judgment: Judgment is impulsive.   Review of Systems  Constitutional:  Negative for chills, diaphoresis and fever.  HENT:  Negative for congestion, ear discharge and nosebleeds.   Eyes:  Negative for photophobia, pain, discharge and redness.  Respiratory:  Negative for cough and shortness of breath.   Cardiovascular:  Negative for chest pain and palpitations.  Gastrointestinal:  Negative for abdominal pain, heartburn, nausea and vomiting.  Genitourinary:  Negative for dysuria, frequency and urgency.  Musculoskeletal:  Negative for falls and myalgias.  Skin:  Negative for itching.  Neurological:  Negative for dizziness, speech change, focal weakness and loss of consciousness.  Endo/Heme/Allergies:        See allergy list   Psychiatric/Behavioral:  Positive for depression, substance abuse and suicidal ideas. The patient is nervous/anxious.    Blood pressure 101/63, pulse 61, temperature 97.7 F (36.5 C), temperature source Oral, resp. rate 17, height 5\' 3"  (1.6 m), weight 59.9 kg, last menstrual period 01/22/2024, SpO2 100%. Body mass index is 23.38 kg/m.  Treatment Plan Summary: Daily contact with patient to assess and evaluate symptoms and progress in treatment and Medication management  Physician Treatment Plan for Primary Diagnosis:  Assessment: Vercie Pokorny is a 28 year old women with a psychiatric diagnosis of bipolar disorder. Denies medical history. She presents to [facility] voluntarily from [facility] for SI without plan. She was medically cleared and transferred to this facility via safe transport.   Principal Diagnosis: Bipolar disorder (HCC) Diagnosis:  Principal Problem:   Bipolar disorder (HCC)  Plan: Medications: -- Initiate Seroquel 200 mg po at bedtime  -- Initiate Seroquel 25 mg po BID -- Continue Trazodone 50 mg po at bedtime prn  sleep -- Continue  Atarax 25 mg po TID prn anxiety  Other PRN meds: -- Continue Tylenol 650 mg po q6h prn pain -- Continue Maalox 30 ml po q4h prn indigestion -- Continue Magnesium hydroxide 30 ml po every day prn constipation  PRN CIWA protocol initiated see MAR.    Initiate BH Agitation Protocol --Haldol 5 mg, oral, 3 times daily as needed, mild agitation --Benadryl 50 mg, oral, 3 times daily as needed, mild agitation                                     OR  --Haldol injection 5 mg, IM, 3 times daily as needed, moderate agitation --Benadryl injection 50 mg, IM, 3 times daily as needed, moderate agitation --Ativan injection 2 mg, IM, 3 times daily as needed, moderate agitation                                     OR --Haldol injection 10 mg, IM, 3 times daily as needed, severe agitation --Benadryl injection 50 mg, IM, 3 times daily as needed, severe agitation --Ativan injection 2 mg, IM, 3 times daily as needed, severe agitation   --  The risks/benefits/side-effects/alternatives to this medication were discussed in detail with the patient and time was given for questions. The patient consents to medication trial.  -- FDA             -- Metabolic profile and EKG monitoring obtained while on an atypical antipsychotic (BMI: Lipid Panel: HbgA1c: QTc:)              -- Encouraged patient to participate in unit milieu and in scheduled group therapies   --The risks/benefits/side-effects/alternatives to this medication were discussed in detail with the patient and time was given for questions. The patient consents to medication trial.  -- Metabolic profile and EKG monitoring obtained while on an atypical antipsychotic (BMI: Lipid Panel: HbgA1c: QTc:)  -- Encouraged patient to participate in unit milieu and in scheduled group therapies    Safety and Monitoring: - Voluntary admission to inpatient psychiatric unit for safety, stabilization and treatment - Daily contact with patient to assess and evaluate symptoms  and progress in treatment - Patient's case to be discussed in multi-disciplinary team meeting - Observation Level : q15 minute checks - Vital signs: q12 hours - Precautions: suicide, but pt currently verbally contracts for safety on unit    Discharge Planning: - Social work and case management to assist with discharge planning and identification of [name] follow-up needs prior to discharge - Estimated LOS: 5-7 days - Discharge Concerns: Need to establish a safety plan; Medication compliance and effectiveness - Discharge Goals: Return home with outpatient referrals for mental health follow-up including medication management/psychotherapy.   Admission labs reviewed and significant for: Glucose 117 high, HGB 11.7 low, Platelets 423 high, UDS Cocaine (+) Cannabinoid (+)  New labs ordered: --Hemoglobin A1c --Lipid panel now -- Baseline ECG ordered   Physician Treatment Plan for Primary Diagnosis: Bipolar disorder (HCC) Long Term Goal(s): Improvement in symptoms so as ready for discharge  Short Term Goals: Ability to identify changes in lifestyle to reduce recurrence of condition will improve, Ability to verbalize feelings will improve, Ability to disclose and discuss suicidal ideas, Ability to demonstrate self-control will improve, Ability to identify and develop effective coping behaviors will improve, Ability  to maintain clinical measurements within normal limits will improve, Compliance with prescribed medications will improve, and Ability to identify triggers associated with substance abuse/mental health issues will improve   I certify that inpatient services furnished can reasonably be expected to improve the patient's condition.    Alan Mulder, FNP & Arn Medal, NP-Student  3/20/20252:56 PM

## 2024-02-08 ENCOUNTER — Encounter (HOSPITAL_COMMUNITY): Payer: Self-pay

## 2024-02-08 DIAGNOSIS — F3164 Bipolar disorder, current episode mixed, severe, with psychotic features: Secondary | ICD-10-CM | POA: Diagnosis not present

## 2024-02-08 MED ORDER — IBUPROFEN 400 MG PO TABS
400.0000 mg | ORAL_TABLET | Freq: Once | ORAL | Status: AC
  Administered 2024-02-08: 400 mg via ORAL
  Filled 2024-02-08 (×2): qty 1

## 2024-02-08 MED ORDER — IBUPROFEN 600 MG PO TABS
600.0000 mg | ORAL_TABLET | Freq: Three times a day (TID) | ORAL | Status: DC | PRN
  Administered 2024-02-09 – 2024-02-12 (×2): 600 mg via ORAL
  Filled 2024-02-08 (×3): qty 1

## 2024-02-08 NOTE — Group Note (Signed)
 Date:  02/08/2024 Time:  6:17 PM  Group Topic/Focus:  Goals Group:   The focus of this group is to help patients establish daily goals to achieve during treatment and discuss how the patient can incorporate goal setting into their daily lives to aide in recovery. Orientation:   The focus of this group is to educate the patient on the purpose and policies of crisis stabilization and provide a format to answer questions about their admission.  The group details unit policies and expectations of patients while admitted.    Participation Level:  Did Not Attend  Participation Quality:   n/a  Affect:   n/a  Cognitive:   n/a  Insight: None  Engagement in Group:   n/a  Modes of Intervention:   n/a  Additional Comments:   Did not attend  Stark Bray 02/08/2024, 6:17 PM

## 2024-02-08 NOTE — Group Note (Signed)
 Recreation Therapy Group Note   Group Topic:Problem Solving  Group Date: 02/08/2024 Start Time: 0930 End Time: 1000 Facilitators: Annaleia Pence-McCall, LRT,CTRS Location: [address] Dayroom   Group Topic: Communication, Team Building, Problem Solving  Goal Area(s) Addresses:  Patient will effectively work with peer towards shared goal.  Patient will identify skills used to make activity successful.  Patient will share challenges and verbalize solution-driven approaches used. Patient will identify how skills used during activity can be used to reach post d/c goals.   Intervention: STEM Activity   Activity: [employer]. Patients were provided the following materials: 5 drinking straws, 5 rubber bands, 5 paper clips, 2 index cards and 2 drinking cups. Using the provided materials patients were asked to build a launching mechanism to launch a ping pong ball across the room, approximately 10 feet. Patients were divided into teams of 3-5. Instructions required all materials be incorporated into the device, functionality of items left to the peer group's discretion.  Education: Pharmacist, community, Scientist, physiological, Air cabin crew, Building control surveyor.   Education Outcome: Acknowledges education/In group clarification offered/Needs additional education.    Affect/Mood: N/A   Participation Level: Did not attend    Clinical Observations/Individualized Feedback:      Plan: Continue to engage patient in RT group sessions 2-3x/week.   Bellarae Lizer-McCall, LRT,CTRS  02/08/2024 12:32 PM

## 2024-02-08 NOTE — BH IP Treatment Plan (Signed)
 Interdisciplinary Treatment and Diagnostic Plan Update  02/08/2024 Time of Session: 10:45 AM Debbie [name] MRN: [id number]  Principal Diagnosis: Bipolar disorder [facility])  Secondary Diagnoses: Principal Problem:   Bipolar disorder (HCC)   Current Medications:  Current Facility-Administered Medications  Medication Dose Route Frequency Provider Last Rate Last Admin   alum & mag hydroxide-simeth (MAALOX/MYLANTA) 200-200-20 MG/5ML suspension 30 mL  30 mL Oral Q4H PRN Lauree Chandler, NP       haloperidol (HALDOL) tablet 5 mg  5 mg Oral TID PRN Lauree Chandler, NP       And   diphenhydrAMINE (BENADRYL) capsule 50 mg  50 mg Oral TID PRN Lauree Chandler, NP       haloperidol lactate (HALDOL) injection 5 mg  5 mg Intramuscular TID PRN Lauree Chandler, NP       And   diphenhydrAMINE (BENADRYL) injection 50 mg  50 mg Intramuscular TID PRN Lauree Chandler, NP       And   LORazepam (ATIVAN) injection 2 mg  2 mg Intramuscular TID PRN Lauree Chandler, NP       haloperidol lactate (HALDOL) injection 10 mg  10 mg Intramuscular TID PRN Lauree Chandler, NP       And   diphenhydrAMINE (BENADRYL) injection 50 mg  50 mg Intramuscular TID PRN Lauree Chandler, NP       And   LORazepam (ATIVAN) injection 2 mg  2 mg Intramuscular TID PRN Lauree Chandler, NP       hydrOXYzine (ATARAX) tablet 25 mg  25 mg Oral TID PRN Lauree Chandler, NP   25 mg at 02/07/24 1026   magnesium hydroxide (MILK OF MAGNESIA) suspension 30 mL  30 mL Oral Daily PRN Lauree Chandler, NP       QUEtiapine (SEROQUEL) tablet 200 mg  200 mg Oral QHS Ntuen, Tina C, FNP   200 mg at 02/07/24 2123   QUEtiapine (SEROQUEL) tablet 25 mg  25 mg Oral BID Cecilie Lowers, FNP   25 mg at 02/08/24 0836   traZODone (DESYREL) tablet 50 mg  50 mg Oral QHS PRN Lauree Chandler, NP   50 mg at 02/07/24 2123   PTA Medications: Medications Prior to Admission  Medication Sig Dispense Refill Last Dose/Taking   amoxicillin  (AMOXIL) 500 MG capsule Take 1 capsule (500 mg total) by mouth 3 (three) times daily. (Patient not taking: Reported on 02/06/2024) 21 capsule 0    doxycycline (VIBRAMYCIN) 100 MG capsule Take 1 capsule (100 mg total) by mouth 2 (two) times daily. (Patient not taking: Reported on 02/06/2024) 14 capsule 0    hydrOXYzine (ATARAX) 25 MG tablet Take 1 tablet (25 mg total) by mouth 3 (three) times daily as needed. (Patient not taking: Reported on 02/06/2024) 30 tablet 0    metroNIDAZOLE (FLAGYL) 500 MG tablet Take 1 tablet (500 mg total) by mouth 2 (two) times daily. (Patient not taking: Reported on 02/06/2024) 14 tablet 0    QUEtiapine (SEROQUEL) 200 MG tablet Take 200 mg by mouth at bedtime. (Patient not taking: Reported on 02/06/2024)      QUEtiapine (SEROQUEL) 25 MG tablet Take 25 mg by mouth 2 (two) times daily. (Patient not taking: Reported on 02/06/2024)       Patient Stressors: Financial difficulties   Health problems   Substance abuse   Traumatic event    Patient Strengths: Ability for insight   Treatment Modalities: Medication Management, Group therapy, Case management,  1 to 1 session with clinician, Psychoeducation, Recreational therapy.   Physician Treatment Plan for Primary Diagnosis: Bipolar disorder (HCC) Long Term Goal(s): Improvement in symptoms so as ready for discharge   Short Term Goals: Ability to identify changes in lifestyle to reduce recurrence of condition will improve Ability to verbalize feelings will improve Ability to disclose and discuss suicidal ideas Ability to demonstrate self-control will improve Ability to identify and develop effective coping behaviors will improve Ability to maintain clinical measurements within normal limits will improve Compliance with prescribed medications will improve Ability to identify triggers associated with substance abuse/mental health issues will improve  Medication Management: Evaluate patient's response, side effects, and  tolerance of medication regimen.  Therapeutic Interventions: 1 to 1 sessions, Unit Group sessions and Medication administration.  Evaluation of Outcomes: Not Progressing  Physician Treatment Plan for Secondary Diagnosis: Principal Problem:   Bipolar disorder (HCC)  Long Term Goal(s): Improvement in symptoms so as ready for discharge   Short Term Goals: Ability to identify changes in lifestyle to reduce recurrence of condition will improve Ability to verbalize feelings will improve Ability to disclose and discuss suicidal ideas Ability to demonstrate self-control will improve Ability to identify and develop effective coping behaviors will improve Ability to maintain clinical measurements within normal limits will improve Compliance with prescribed medications will improve Ability to identify triggers associated with substance abuse/mental health issues will improve     Medication Management: Evaluate patient's response, side effects, and tolerance of medication regimen.  Therapeutic Interventions: 1 to 1 sessions, Unit Group sessions and Medication administration.  Evaluation of Outcomes: Not Progressing   RN Treatment Plan for Primary Diagnosis: Bipolar disorder (HCC) Long Term Goal(s): Knowledge of disease and therapeutic regimen to maintain health will improve  Short Term Goals: Ability to remain free from injury will improve, Ability to verbalize frustration and anger appropriately will improve, Ability to demonstrate self-control, Ability to participate in decision making will improve, Ability to verbalize feelings will improve, Ability to disclose and discuss suicidal ideas, Ability to identify and develop effective coping behaviors will improve, and Compliance with prescribed medications will improve  Medication Management: RN will administer medications as ordered by provider, will assess and evaluate patient's response and provide education to patient for prescribed  medication. RN will report any adverse and/or side effects to prescribing provider.  Therapeutic Interventions: 1 on 1 counseling sessions, Psychoeducation, Medication administration, Evaluate responses to treatment, Monitor vital signs and CBGs as ordered, Perform/monitor CIWA, COWS, AIMS and Fall Risk screenings as ordered, Perform wound care treatments as ordered.  Evaluation of Outcomes: Not Progressing   LCSW Treatment Plan for Primary Diagnosis: Bipolar disorder [facility]) Long Term Goal(s): Safe transition to appropriate next level of care at discharge, Engage patient in therapeutic group addressing interpersonal concerns.  Short Term Goals: Engage patient in aftercare planning with referrals and resources, Increase social support, Increase ability to appropriately verbalize feelings, Increase emotional regulation, Facilitate acceptance of mental health diagnosis and concerns, Facilitate patient progression through stages of change regarding substance use diagnoses and concerns, Identify triggers associated with mental health/substance abuse issues, and Increase skills for wellness and recovery  Therapeutic Interventions: Assess for all discharge needs, 1 to 1 time with Social worker, Explore available resources and support systems, Assess for adequacy in community support network, Educate family and significant other(s) on suicide prevention, Complete Psychosocial Assessment, Interpersonal group therapy.  Evaluation of Outcomes: Not Progressing   Progress in Treatment: Attending groups: Yes. Participating in groups: Yes. Taking medication as  prescribed: Yes. Toleration medication: Yes. Family/Significant other contact made: consents are pending Patient understands diagnosis: Yes. Discussing patient identified problems/goals with staff: Yes. Medical problems stabilized or resolved: Yes. Denies suicidal/homicidal ideation: Yes. Issues/concerns per patient self-inventory: No.  New  problem(s) identified: No  New Short Term/Long Term Goal(s):    medication stabilization, elimination of SI thoughts, development of comprehensive mental wellness plan.   Patient Goals:  "I want to get back on my meds."   Discharge Plan or Barriers:  Patient recently admitted. CSW will continue to follow and assess for appropriate referrals and possible discharge planning.   Reason for Continuation of Hospitalization: Medication stabilization Suicidal ideation  Estimated Length of Stay:  5 - 7 days  Last 3 [place] Suicide Severity Risk Score: Flowsheet Row [facility] (Current[facility] Most recent reading at 02/06/2024  5:41 PM ED from 02/06/2024 in [facility] Department at [facility] Most recent reading at 02/05/2024 10:58 PM ED from 10/08/2023 in [facility] Emergency Department at [facility] Most recent reading at 10/08/2023  1:01 PM  C-SSRS RISK CATEGORY Low Risk Low Risk No Risk       Last PHQ 2/9 Scores:    08/10/2023    1:11 PM  Depression screen PHQ 2/9  Decreased Interest 1  Down, Depressed, Hopeless 1  PHQ - 2 Score 2  Altered sleeping 1  Tired, decreased energy 1  Change in appetite 0  Feeling bad or failure about yourself  1  Trouble concentrating 0  Moving slowly or fidgety/restless 0  Suicidal thoughts 1  PHQ-9 Score 6  Difficult doing work/chores Somewhat difficult    Scribe for Treatment Team: Alla Feeling, LCSWA 02/08/2024 12:16 PM

## 2024-02-08 NOTE — Progress Notes (Signed)
   02/07/24 2100  Psych Admission Type (Psych Patients Only)  Admission Status Voluntary  Psychosocial Assessment  Patient Complaints Anxiety;Depression (Pt endorsed 6/10 anxiety and 7/10 depression.  Pt appeared flat and offered minimal responses.  She stated, "I like to be on my own but I went to some groups." Observed pt to be present in milieu.  Pt denied SI/HI/AVH.)  Eye Contact Fair  Facial Expression Flat;Pensive  Affect Appropriate to circumstance  Speech Soft  Interaction Guarded  Motor Activity Restless  Appearance/Hygiene Unremarkable  Behavior Characteristics Cooperative  Mood Anxious  Thought Process  Coherency WDL  Content WDL  Delusions None reported or observed  Perception WDL  Hallucination None reported or observed  Judgment Impaired  Confusion None  Danger to Self  Current suicidal ideation? Denies  Self-Injurious Behavior No self-injurious ideation or behavior indicators observed or expressed   Agreement Not to Harm Self Yes  Description of Agreement Verbal  Danger to Others  Danger to Others None reported or observed

## 2024-02-08 NOTE — Plan of Care (Signed)
   Problem: Safety: Goal: Periods of time without injury will increase Outcome: Progressing   Problem: Physical Regulation: Goal: Ability to maintain clinical measurements within normal limits will improve Outcome: Progressing

## 2024-02-08 NOTE — Progress Notes (Signed)
   02/08/24 0920  Psych Admission Type (Psych Patients Only)  Admission Status Voluntary  Psychosocial Assessment  Patient Complaints Anxiety;Depression  Eye Contact Brief  Facial Expression Flat  Affect Irritable;Anxious  Speech Logical/coherent  Interaction Guarded;Assertive  Motor Activity Lethargic  Appearance/Hygiene Unremarkable  Behavior Characteristics Irritable;Guarded;Unwilling to participate  Thought Process  Coherency WDL  Content WDL  Delusions None reported or observed  Perception WDL  Hallucination None reported or observed  Judgment Impaired  Confusion None  Danger to Self  Current suicidal ideation? Denies  Agreement Not to Harm Self Yes  Description of Agreement Verbal  Danger to Others  Danger to Others None reported or observed

## 2024-02-08 NOTE — Progress Notes (Addendum)
 Initial [facility] MD Progress Note  02/08/2024 9:05 PM Debbie [name]  MRN:  [id number] Principal Problem: Bipolar disorder [facility]) Diagnosis: Principal Problem:   Bipolar disorder (HCC)  Total Time spent with patient: 45 minutes  Reason for Admission:  Debbie [name] is a 28 year old women with a psychiatric diagnosis of bipolar disorder. Denies medical history. She presents to [facility] voluntarily from [facility] for SI without plan. She was medically cleared and transferred to this facility via safe transport.    Chart review from last 24 hours: Chart reviewed today. Patient discussed at multidisciplinary team meeting. Vital signs within defined limit. Documented sleep hours:  10.0. Nursing staff reported patient is compliant with routine psychotropic medication regimen without difficulty. Required as needed trazodone, according to nursing record.    Yesterday the psychiatry team made the following recommendations: -- Initiate Seroquel 200 mg po at bedtime  -- Initiate Seroquel 25 mg po BID   Daily Evaluation:  On assessment today, the patient was observed lying in bed asleep. Upon awakening, the patient was unsure how to describe her mood, stating "I do not know."  She rates both anxiety and depression at 7/10, with 10 being the most severe. She reports a headache, noting partial relief with Motrin. States she has experienced headaches prior to hospitalization. Denies suicidal ideation, self-harm urges, and homicidal ideation. Denies auditory or visual hallucinations, and paranoia. The patient reports she has not attended any group sessions today; encouraged to participate once feeling better.  Denies having side effects to current psychiatric medications.   She appears guarded during the encounter with a flat affect and minimal eye contact.  She continues to report depressive and anxious symptoms.  Physical discomfort (headache) may be contributing to her current presentation. She was  otherwise calm and cooperative during the assessment.  No EPS symptoms were observed during the exam.       The  case was discussed with the attending psychiatrist, N. Abbott Pao. We will resume Cymbalta 60 mg daily. Ibuprofen 600 mg every 8 hours as needed will be added to address headaches.     Discharge Planning: Continued hospitalization is recommended for mood stabilization and to assess the patient's response and tolerability to medication adjustments.   Past Psychiatric History: See [name]&P  Past Medical History:  Past Medical History:  Diagnosis Date   Migraine    No pertinent past medical history    Urinary tract infection     Past Surgical History:  Procedure Laterality Date   APPENDECTOMY     Family History:  Family History  Problem Relation Age of Onset   Muscular dystrophy Cousin        Duchenne Muscular Dystrophy   Muscular dystrophy Cousin    Clotting disorder Mother        has a filter   Asthma Mother    Depression Mother    Hypertension Father    Family Psychiatric  History: See [name]&P Social History:  Social History   Substance and Sexual Activity  Alcohol Use No   Alcohol/week: 0.0 standard drinks of alcohol   Comment: social     Social History   Substance and Sexual Activity  Drug Use Yes   Types: Cocaine, Marijuana   Comment: fentanyl    Social History   Socioeconomic History   Marital status: Married    Spouse name: Not on file   Number of children: Not on file   Years of education: Not on file   Highest education  level: Not on file  Occupational History   Not on file  Tobacco Use   Smoking status: Some Days    Current packs/day: 1.00    Types: Cigarettes   Smokeless tobacco: Never  Vaping Use   Vaping status: Not on file  Substance and Sexual Activity   Alcohol use: No    Alcohol/week: 0.0 standard drinks of alcohol    Comment: social   Drug use: Yes    Types: Cocaine, Marijuana    Comment: fentanyl   Sexual activity: Yes     Partners: Male    Birth control/protection: OCP  Other Topics Concern   Not on file  Social History Narrative   Not on file   Social Drivers of Health   Financial Resource Strain: Not on file  Food Insecurity: Patient Declined (02/06/2024)   Hunger Vital Sign    Worried About Running Out of Food in the Last Year: Patient declined    Ran Out of Food in the Last Year: Patient declined  Transportation Needs: No Transportation Needs (02/06/2024)   PRAPARE - Administrator, Civil Service (Medical): No    Lack of Transportation (Non-Medical): No  Physical Activity: Not on file  Stress: Not on file  Social Connections: Not on file   Additional Social History:                         Current Medications: Current Facility-Administered Medications  Medication Dose Route Frequency Provider Last Rate Last Admin   alum & mag hydroxide-simeth (MAALOX/MYLANTA) 200-200-20 MG/5ML suspension 30 mL  30 mL Oral Q4H PRN Lauree Chandler, NP       haloperidol (HALDOL) tablet 5 mg  5 mg Oral TID PRN Lauree Chandler, NP       And   diphenhydrAMINE (BENADRYL) capsule 50 mg  50 mg Oral TID PRN Lauree Chandler, NP       haloperidol lactate (HALDOL) injection 5 mg  5 mg Intramuscular TID PRN Lauree Chandler, NP       And   diphenhydrAMINE (BENADRYL) injection 50 mg  50 mg Intramuscular TID PRN Lauree Chandler, NP       And   LORazepam (ATIVAN) injection 2 mg  2 mg Intramuscular TID PRN Lauree Chandler, NP       haloperidol lactate (HALDOL) injection 10 mg  10 mg Intramuscular TID PRN Lauree Chandler, NP       And   diphenhydrAMINE (BENADRYL) injection 50 mg  50 mg Intramuscular TID PRN Lauree Chandler, NP       And   LORazepam (ATIVAN) injection 2 mg  2 mg Intramuscular TID PRN Lauree Chandler, NP       hydrOXYzine (ATARAX) tablet 25 mg  25 mg Oral TID PRN Lauree Chandler, NP   25 mg at 02/07/24 1026   ibuprofen (ADVIL) tablet 600 mg  600 mg  Oral Q8H PRN Debbie Traub H, NP       magnesium hydroxide (MILK OF MAGNESIA) suspension 30 mL  30 mL Oral Daily PRN Lauree Chandler, NP       QUEtiapine (SEROQUEL) tablet 200 mg  200 mg Oral QHS Ntuen, Tina C, FNP   200 mg at 02/07/24 2123   QUEtiapine (SEROQUEL) tablet 25 mg  25 mg Oral BID Cecilie Lowers, FNP   25 mg at 02/08/24 1809   traZODone (DESYREL) tablet 50 mg  50  mg Oral QHS PRN Lauree Chandler, NP   50 mg at 02/07/24 2123    Lab Results:  Results for orders placed or performed during the hospital encounter of 02/06/24 (from the past 48 hours)  Hemoglobin A1c     Status: Abnormal   Collection Time: 02/07/24  6:56 PM  Result Value Ref Range   Hgb A1c MFr Bld 5.8 ([name]) 4.8 - 5.6 %    Comment: (NOTE) Pre diabetes:          5.7%-6.4%  Diabetes:              >6.4%  Glycemic control for   <7.0% adults with diabetes    Mean Plasma Glucose 119.76 mg/dL    Comment: Performed at [facility], [address] [place], [place] [address]  Lipid panel     Status: None   Collection Time: 02/07/24  6:56 PM  Result Value Ref Range   Cholesterol 141 0 - 200 mg/dL   Triglycerides 086 <578 mg/dL   HDL 61 >46 mg/dL   Total CHOL/HDL Ratio 2.3 RATIO   VLDL 23 0 - 40 mg/dL   LDL Cholesterol 57 0 - 99 mg/dL    Comment:        Total Cholesterol/HDL:CHD Risk Coronary Heart Disease Risk Table                     Men   Women  1/2 Average Risk   3.4   3.3  Average Risk       5.0   4.4  2 X Average Risk   9.6   7.1  3 X Average Risk  23.4   11.0        Use the calculated Patient Ratio above and the CHD Risk Table to determine the patient's CHD Risk.        ATP III CLASSIFICATION (LDL):  <100     mg/dL   Optimal  962-952  mg/dL   Near or Above                    Optimal  130-159  mg/dL   Borderline  841-324  mg/dL   High  >401     mg/dL   Very High Performed at [facility], [address], [place], [place] [address]     Blood Alcohol level:  Lab  Results  Component Value Date   ETH <10 02/05/2024    Metabolic Disorder Labs: Lab Results  Component Value Date   HGBA1C 5.8 ([name]) 02/07/2024   MPG 119.76 02/07/2024   No results found for: "PROLACTIN" Lab Results  Component Value Date   CHOL 141 02/07/2024   TRIG 116 02/07/2024   HDL 61 02/07/2024   CHOLHDL 2.3 02/07/2024   VLDL 23 02/07/2024   LDLCALC 57 02/07/2024    Physical Findings: AIMS:  , ,  0,  ,    CIWA:   N/A COWS:   N/A  Musculoskeletal: Strength & Muscle Tone: within normal limits Gait & Station: normal Patient leans: N/A  Psychiatric Specialty Exam:  Presentation  General Appearance:  Disheveled  Eye Contact: Poor  Speech: Clear and Coherent; Normal Rate  Speech Volume: Decreased  Handedness: Right   Mood and Affect  Mood: Dysphoric  Affect: Labile   Thought Process  Thought Processes: Coherent; Goal Directed; Linear  Descriptions of Associations:Intact  Orientation:Full (Time, Place and Person)  Thought Content:Logical  History of Schizophrenia/Schizoaffective disorder:No  Duration of Psychotic Symptoms:No data recorded Hallucinations:Hallucinations: Other (comment) (Patient did not answer interviewer.)  Ideas of Reference:None  Suicidal Thoughts:Suicidal Thoughts: No  Homicidal Thoughts:Homicidal Thoughts: No   Sensorium  Memory: Immediate Fair  Judgment: Fair  Insight: Fair   Art therapist  Concentration: Fair  Attention Span: Fair  Recall: [employer] of Knowledge: Fair  Language: Fair   Psychomotor Activity  Psychomotor Activity: Psychomotor Activity: Normal   Assets  Assets: Desire for Improvement; Social Support   Sleep  Sleep: Sleep: Fair Number of Hours of Sleep: 0 (Patient did not answer interviewer.)    Physical Exam: Physical Exam Vitals and nursing note reviewed.  Constitutional:      General: She is not in acute distress.    Appearance: She is not  ill-appearing.  Cardiovascular:     Rate and Rhythm: Normal rate.     Pulses: Normal pulses.  Pulmonary:     Effort: No respiratory distress.  Neurological:     Mental Status: She is alert and oriented to person, place, and time.    Review of Systems  Constitutional:  Negative for chills and fever.  Respiratory:  Negative for shortness of breath.   Cardiovascular:  Negative for chest pain and palpitations.  Gastrointestinal:  Negative for constipation, diarrhea, nausea and vomiting.  Skin: Negative.   Neurological:  Negative for dizziness, tingling, tremors and headaches.  Psychiatric/Behavioral:  Positive for depression and substance abuse. Negative for hallucinations and suicidal ideas. The patient is nervous/anxious. The patient does not have insomnia.    Blood pressure 107/69, pulse 88, temperature 97.7 F (36.5 C), temperature source Oral, resp. rate 17, height 5\' 3"  (1.6 m), weight 59.9 kg, last menstrual period 01/22/2024, SpO2 100%. Body mass index is 23.38 kg/m.   Treatment Plan Summary: Daily contact with patient to assess and evaluate symptoms and progress in treatment and Medication management   Physician Treatment Plan for Primary Diagnosis:  Assessment: Debbie [name] is a 28 year old women with a psychiatric diagnosis of bipolar disorder. Denies medical history. She presents to [facility] voluntarily from [facility] for SI without plan. She was medically cleared and transferred to this facility via safe transport.    Principal Diagnosis: Bipolar disorder (HCC) Diagnosis:  Principal Problem:   Bipolar disorder (HCC)   Plan: Medications: -- Continue Seroquel 200 mg po at bedtime  -- Continue Seroquel 25 mg po BID -- Start Motrin 600 mg every 6 hours, as needed, headache, mild pain     Continue As Needed Medications --Hydroxyzine 25 mg oral, 3 times daily as needed, anxiety --Trazodone 50 mg, oral, daily at bedtime as needed, sleep --Maalox/Mylanta 30  mL oral every 4 hours as needed, indigestion --Milk of Magnesia 30 mL oral daily as needed, mild constipation   Continue BH Agitation Protocol --Haldol 5 mg, oral, 3 times daily as needed, mild agitation --Benadryl 50 mg, oral, 3 times daily as needed, mild agitation                                     OR  --Haldol injection 5 mg, IM, 3 times daily as needed, moderate agitation --Benadryl injection 50 mg, IM, 3 times daily as needed, moderate agitation --Ativan injection 2 mg, IM, 3 times daily as needed, moderate agitation  OR --Haldol injection 10 mg, IM, 3 times daily as needed, severe agitation --Benadryl injection 50 mg, IM, 3 times daily as needed, severe agitation --Ativan injection 2 mg, IM, 3 times daily as needed, severe agitation   --  The risks/benefits/side-effects/alternatives to this medication were discussed in detail with the patient and time was given for questions. The patient consents to medication trial.  -- FDA             -- Metabolic profile and EKG monitoring obtained while on an atypical antipsychotic (BMI: Lipid Panel: HbgA1c: QTc:)              -- Encouraged patient to participate in unit milieu and in scheduled group therapies    --The risks/benefits/side-effects/alternatives to this medication were discussed in detail with the patient and time was given for questions. The patient consents to medication trial.  -- Metabolic profile and EKG monitoring obtained while on an atypical antipsychotic (BMI: Lipid Panel: HbgA1c: QTc:)  -- Encouraged patient to participate in unit milieu and in scheduled group therapies    Safety and Monitoring: - Voluntary admission to inpatient psychiatric unit for safety, stabilization and treatment - Daily contact with patient to assess and evaluate symptoms and progress in treatment - Patient's case to be discussed in multi-disciplinary team meeting - Observation Level : q15 minute checks -  Vital signs: q12 hours - Precautions: suicide, but pt currently verbally contracts for safety on unit    Discharge Planning: - Social work and case management to assist with discharge planning and identification of hospital follow-up needs prior to discharge - Estimated LOS: 5-7 days - Discharge Concerns: Need to establish a safety plan; Medication compliance and effectiveness - Discharge Goals: Return home with outpatient referrals for mental health follow-up including medication management/psychotherapy.   Admission labs reviewed and significant for: Glucose 117 high, HGB 11.7 low, Platelets 423 high, UDS Cocaine (+) Cannabinoid (+)   New labs ordered: --Hemoglobin A1c elevated at 5.8 --Lipid panel within range -- Baseline ECG ordered    I certify that inpatient services furnished can reasonably be expected to improve the patient's condition.       Norma Fredrickson, NP 02/08/2024, 9:05 PM

## 2024-02-08 NOTE — [facility] Counselor (Signed)
 Adult Comprehensive Assessment  Patient ID: Debbie [name], female   DOB: 13-Jan-1996, 28 y.o.   MRN: [id number]  1st att on PSA 02/07/24, pt refused and asked writer to leave room.  2nd att on PSA 02/08/24, pt refused.   Pt aware and agreeable that PSA is to be done on 02/09/24.    Kathi Der. 02/08/2024

## 2024-02-09 DIAGNOSIS — F3164 Bipolar disorder, current episode mixed, severe, with psychotic features: Secondary | ICD-10-CM | POA: Diagnosis not present

## 2024-02-09 MED ORDER — DULOXETINE HCL 60 MG PO CPEP
60.0000 mg | ORAL_CAPSULE | Freq: Every day | ORAL | Status: DC
  Administered 2024-02-09 – 2024-02-12 (×4): 60 mg via ORAL
  Filled 2024-02-09 (×7): qty 1

## 2024-02-09 NOTE — Progress Notes (Signed)
 Initial [facility] MD Progress Note  02/09/2024 4:26 PM Debbie [name]  MRN:  [id number] Principal Problem: Bipolar disorder [facility]) Diagnosis: Principal Problem:   Bipolar disorder (HCC)  Total Time spent with patient: 45 minutes  Reason for Admission:  Debbie [name] is a 28 year old women with a psychiatric diagnosis of bipolar disorder. Denies medical history. She presents to [facility] voluntarily from [facility] for SI without plan. She was medically cleared and transferred to this facility via safe transport.    Chart review from last 24 hours: Chart reviewed today. Patient discussed at multidisciplinary team meeting. Vital signs within defined ranges. Documented sleep hours: 8.25. Nursing staff reported patient was irritable and using profanity in front of the dayroom last night. She is compliant with routine psychotropic medication regimen. Required as needed trazodone last night, according to the nursing record.     Daily Evaluation:  On assessment today, the patient was observed lying in bed asleep.  She reports feeling tired and states her energy levels remain low, with no improvement since admission.   Rates anxiety as 5/10 and depression as 6/10, with 10 being highest severity. Identifies stress as a primary trigger, citing "normal stuff." She reports no improvement in racing thoughts since admission. She reports waking up multiple times throughout the night. Patient reports previously being on gabapentin but was switched to Wellbutrin in addition to Cymbalta.  She states that Seroquel has been effective for her. She describes appetite is fair but states she does not like the food provided.  Denies suicidal ideation, self-harm urges, and homicidal ideation. Denies auditory or visual hallucinations, and paranoia.   Denies having side effects to current psychiatric medications.   Patient reports not attending any groups on the unit yesterday or thus far today. She was strongly  encouraged to participate in groups this afternoon to avoid room lockout, respectively.  The patient verbalized understanding.  Patient continues to experience persistent low energy, sleep disturbances, and racing thoughts. Anxiety and depression remain moderate, with no reported improvement since admission. Medication adjustments may be necessary to better manage symptoms. We will consider optimizing Seroquel dosage to improve sleep and reduce racing thoughts. No EPS symptoms were observed during the exam.       The  case was discussed with the attending psychiatrist, N. Abbott Pao; continued hospitalization remains necessary at this time.     Past Psychiatric History: See H&P  Past Medical History:  Past Medical History:  Diagnosis Date   Migraine    No pertinent past medical history    Urinary tract infection     Past Surgical History:  Procedure Laterality Date   APPENDECTOMY     Family History:  Family History  Problem Relation Age of Onset   Muscular dystrophy Cousin        Duchenne Muscular Dystrophy   Muscular dystrophy Cousin    Clotting disorder Mother        has a filter   Asthma Mother    Depression Mother    Hypertension Father    Family Psychiatric  History: See H&P Social History:  Social History   Substance and Sexual Activity  Alcohol Use No   Alcohol/week: 0.0 standard drinks of alcohol   Comment: social     Social History   Substance and Sexual Activity  Drug Use Yes   Types: Cocaine, Marijuana   Comment: fentanyl    Social History   Socioeconomic History   Marital status: Married    Spouse name:  Not on file   Number of children: Not on file   Years of education: Not on file   Highest education level: Not on file  Occupational History   Not on file  Tobacco Use   Smoking status: Some Days    Current packs/day: 1.00    Types: Cigarettes   Smokeless tobacco: Never  Vaping Use   Vaping status: Not on file  Substance and Sexual Activity    Alcohol use: No    Alcohol/week: 0.0 standard drinks of alcohol    Comment: social   Drug use: Yes    Types: Cocaine, Marijuana    Comment: fentanyl   Sexual activity: Yes    Partners: Male    Birth control/protection: OCP  Other Topics Concern   Not on file  Social History Narrative   Not on file   Social Drivers of Health   Financial Resource Strain: Not on file  Food Insecurity: Patient Declined (02/06/2024)   Hunger Vital Sign    Worried About Running Out of Food in the Last Year: Patient declined    Ran Out of Food in the Last Year: Patient declined  Transportation Needs: No Transportation Needs (02/06/2024)   PRAPARE - Administrator, Civil Service (Medical): No    Lack of Transportation (Non-Medical): No  Physical Activity: Not on file  Stress: Not on file  Social Connections: Not on file   Additional Social History:                         Current Medications: Current Facility-Administered Medications  Medication Dose Route Frequency Provider Last Rate Last Admin   alum & mag hydroxide-simeth (MAALOX/MYLANTA) 200-200-20 MG/5ML suspension 30 mL  30 mL Oral Q4H PRN Lauree Chandler, NP       haloperidol (HALDOL) tablet 5 mg  5 mg Oral TID PRN Lauree Chandler, NP       And   diphenhydrAMINE (BENADRYL) capsule 50 mg  50 mg Oral TID PRN Lauree Chandler, NP       haloperidol lactate (HALDOL) injection 5 mg  5 mg Intramuscular TID PRN Lauree Chandler, NP       And   diphenhydrAMINE (BENADRYL) injection 50 mg  50 mg Intramuscular TID PRN Lauree Chandler, NP       And   LORazepam (ATIVAN) injection 2 mg  2 mg Intramuscular TID PRN Lauree Chandler, NP       haloperidol lactate (HALDOL) injection 10 mg  10 mg Intramuscular TID PRN Lauree Chandler, NP       And   diphenhydrAMINE (BENADRYL) injection 50 mg  50 mg Intramuscular TID PRN Lauree Chandler, NP       And   LORazepam (ATIVAN) injection 2 mg  2 mg Intramuscular TID  PRN Lauree Chandler, NP       DULoxetine (CYMBALTA) DR capsule 60 mg  60 mg Oral Daily Donoven Pett H, NP   60 mg at 02/09/24 1104   hydrOXYzine (ATARAX) tablet 25 mg  25 mg Oral TID PRN Lauree Chandler, NP   25 mg at 02/07/24 1026   ibuprofen (ADVIL) tablet 600 mg  600 mg Oral Q8H PRN Daylyn Azbill H, NP       magnesium hydroxide (MILK OF MAGNESIA) suspension 30 mL  30 mL Oral Daily PRN Lauree Chandler, NP       QUEtiapine (SEROQUEL) tablet 200 mg  200 mg Oral QHS Ntuen, Jesusita Oka, FNP   200 mg at 02/08/24 2130   QUEtiapine (SEROQUEL) tablet 25 mg  25 mg Oral BID Cecilie Lowers, FNP   25 mg at 02/09/24 0816   traZODone (DESYREL) tablet 50 mg  50 mg Oral QHS PRN Lauree Chandler, NP   50 mg at 02/08/24 2130    Lab Results:  Results for orders placed or performed during the hospital encounter of 02/06/24 (from the past 48 hours)  Hemoglobin A1c     Status: Abnormal   Collection Time: 02/07/24  6:56 PM  Result Value Ref Range   Hgb A1c MFr Bld 5.8 (H) 4.8 - 5.6 %    Comment: (NOTE) Pre diabetes:          5.7%-6.4%  Diabetes:              >6.4%  Glycemic control for   <7.0% adults with diabetes    Mean Plasma Glucose 119.76 mg/dL    Comment: Performed at [facility], [address], [place], [place] [address]  Lipid panel     Status: None   Collection Time: 02/07/24  6:56 PM  Result Value Ref Range   Cholesterol 141 0 - 200 mg/dL   Triglycerides 604 <540 mg/dL   HDL 61 >98 mg/dL   Total CHOL/HDL Ratio 2.3 RATIO   VLDL 23 0 - 40 mg/dL   LDL Cholesterol 57 0 - 99 mg/dL    Comment:        Total Cholesterol/HDL:CHD Risk Coronary Heart Disease Risk Table                     Men   Women  1/2 Average Risk   3.4   3.3  Average Risk       5.0   4.4  2 X Average Risk   9.6   7.1  3 X Average Risk  23.4   11.0        Use the calculated Patient Ratio above and the CHD Risk Table to determine the patient's CHD Risk.        ATP III CLASSIFICATION (LDL):   <100     mg/dL   Optimal  119-147  mg/dL   Near or Above                    Optimal  130-159  mg/dL   Borderline  829-562  mg/dL   High  >130     mg/dL   Very High Performed at [facility], [address], [place], [place] [address]     Blood Alcohol level:  Lab Results  Component Value Date   ETH <10 02/05/2024    Metabolic Disorder Labs: Lab Results  Component Value Date   HGBA1C 5.8 (H) 02/07/2024   MPG 119.76 02/07/2024   No results found for: "PROLACTIN" Lab Results  Component Value Date   CHOL 141 02/07/2024   TRIG 116 02/07/2024   HDL 61 02/07/2024   CHOLHDL 2.3 02/07/2024   VLDL 23 02/07/2024   LDLCALC 57 02/07/2024    Physical Findings: AIMS:  , ,  0,  ,    CIWA:   N/A COWS:   N/A  Musculoskeletal: Strength & Muscle Tone: within normal limits Gait & Station: normal Patient leans: N/A  Psychiatric Specialty Exam:  Presentation  General Appearance:  Casual; Fairly Groomed  Eye Contact: Fair  Speech: Clear and Coherent;  Normal Rate  Speech Volume: Decreased  Handedness: Right   Mood and Affect  Mood: Dysphoric  Affect: Flat; Depressed   Thought Process  Thought Processes: Coherent; Goal Directed; Linear  Descriptions of Associations:Intact  Orientation:Full (Time, Place and Person)  Thought Content:Logical  History of Schizophrenia/Schizoaffective disorder:No  Duration of Psychotic Symptoms:No data recorded Hallucinations:Hallucinations: None   Ideas of Reference:None  Suicidal Thoughts:Suicidal Thoughts: No  Homicidal Thoughts:Homicidal Thoughts: No   Sensorium  Memory: Immediate Fair  Judgment: Fair  Insight: Fair   Art therapist  Concentration: Fair  Attention Span: Good  Recall: Fair  Fund of Knowledge: Fair  Language: Fair   Psychomotor Activity  Psychomotor Activity: Psychomotor Activity: Normal   Assets  Assets: Communication Skills; Desire for  Improvement; Resilience   Sleep  Sleep: Sleep: Fair    Physical Exam: Physical Exam Vitals and nursing note reviewed.  Constitutional:      General: She is not in acute distress.    Appearance: Normal appearance. She is not ill-appearing.  Cardiovascular:     Rate and Rhythm: Normal rate.     Pulses: Normal pulses.  Pulmonary:     Effort: No respiratory distress.  Skin:    Coloration: Skin is not pale.  Neurological:     Mental Status: She is alert and oriented to person, place, and time.    Review of Systems  Constitutional:  Negative for chills and fever.  Respiratory:  Negative for shortness of breath.   Cardiovascular:  Negative for chest pain and palpitations.  Gastrointestinal:  Negative for constipation, diarrhea, heartburn, nausea and vomiting.  Skin: Negative.   Neurological:  Negative for dizziness, tingling, tremors and headaches.  Psychiatric/Behavioral:  Positive for depression and substance abuse. Negative for hallucinations and suicidal ideas. The patient is nervous/anxious. The patient does not have insomnia.    Blood pressure 114/72, pulse 97, temperature 98.2 F (36.8 C), temperature source Oral, resp. rate 16, height 5\' 3"  (1.6 m), weight 59.9 kg, last menstrual period 01/22/2024, SpO2 99%. Body mass index is 23.38 kg/m.   Treatment Plan Summary: Daily contact with patient to assess and evaluate symptoms and progress in treatment and Medication management   Physician Treatment Plan for Primary Diagnosis:  Assessment: Debbie [name] is a 28 year old women with a psychiatric diagnosis of bipolar disorder. Denies medical history. She presents to [facility] voluntarily from [facility] for SI without plan. She was medically cleared and transferred to this facility via safe transport.    Principal Diagnosis: Bipolar disorder (HCC) Diagnosis:  Principal Problem:   Bipolar disorder (HCC)   Plan: Medications: -- Continue Seroquel 200 mg po at  bedtime  -- Continue Seroquel 25 mg po BID    Continue As Needed Medications --Hydroxyzine 25 mg oral, 3 times daily as needed, anxiety --Trazodone 50 mg, oral, daily at bedtime as needed, sleep --Maalox/Mylanta 30 mL oral every 4 hours as needed, indigestion --Milk of Magnesia 30 mL oral daily as needed, mild constipation --- Motrin 600 mg every 6 hours, as needed, headache, mild pain   Continue BH Agitation Protocol --Haldol 5 mg, oral, 3 times daily as needed, mild agitation --Benadryl 50 mg, oral, 3 times daily as needed, mild agitation                                     OR  --Haldol injection 5 mg, IM, 3 times daily as needed,  moderate agitation --Benadryl injection 50 mg, IM, 3 times daily as needed, moderate agitation --Ativan injection 2 mg, IM, 3 times daily as needed, moderate agitation                                     OR --Haldol injection 10 mg, IM, 3 times daily as needed, severe agitation --Benadryl injection 50 mg, IM, 3 times daily as needed, severe agitation --Ativan injection 2 mg, IM, 3 times daily as needed, severe agitation   --  The risks/benefits/side-effects/alternatives to this medication were discussed in detail with the patient and time was given for questions. The patient consents to medication trial.  -- FDA             -- Metabolic profile and EKG monitoring obtained while on an atypical antipsychotic (BMI: Lipid Panel: HbgA1c: QTc:)              -- Encouraged patient to participate in unit milieu and in scheduled group therapies    --The risks/benefits/side-effects/alternatives to this medication were discussed in detail with the patient and time was given for questions. The patient consents to medication trial.  -- Metabolic profile and EKG monitoring obtained while on an atypical antipsychotic (BMI: Lipid Panel: HbgA1c: QTc:)  -- Encouraged patient to participate in unit milieu and in scheduled group therapies    Safety and Monitoring: - Voluntary  admission to inpatient psychiatric unit for safety, stabilization and treatment - Daily contact with patient to assess and evaluate symptoms and progress in treatment - Patient's case to be discussed in multi-disciplinary team meeting - Observation Level : q15 minute checks - Vital signs: q12 hours - Precautions: suicide, but pt currently verbally contracts for safety on unit    Discharge Planning: - Social work and case management to assist with discharge planning and identification of hospital follow-up needs prior to discharge - Estimated LOS: 5-7 days - Discharge Concerns: Need to establish a safety plan; Medication compliance and effectiveness - Discharge Goals: Return home with outpatient referrals for mental health follow-up including medication management/psychotherapy.   Admission labs reviewed and significant for: Glucose 117 high, HGB 11.7 low, Platelets 423 high, UDS Cocaine (+) Cannabinoid (+)   New labs ordered: --Hemoglobin A1c elevated at 5.8 --Lipid panel within range -- Baseline ECG ordered    I certify that inpatient services furnished can reasonably be expected to improve the patient's condition.       Norma Fredrickson, NP 02/09/2024, 4:26 PM Patient ID: Toribio Harbour, female   DOB: [birth year], 28 y.o.   MRN: [id number]

## 2024-02-09 NOTE — Plan of Care (Signed)
   Problem: Education: Goal: Mental status will improve Outcome: Progressing Goal: Verbalization of understanding the information provided will improve Outcome: Progressing   Problem: Activity: Goal: Interest or engagement in activities will improve Outcome: Progressing

## 2024-02-09 NOTE — [facility] Counselor (Signed)
 CSW attempted to complete PSA with patient x's 2. Patient was asleep both times CSW entered patient's room. CSW will continue to make attempts at PSA.

## 2024-02-09 NOTE — Group Note (Signed)
 Date:  02/09/2024 Time:  4:26 PM  Group Topic/Focus:  Personal Choices and Values:   The focus of this group is to help patients assess and explore the importance of values in their lives, how their values affect their decisions, how they express their values and what opposes their expression.    Participation Level:  Did Not Attend  Shela Nevin 02/09/2024, 4:26 PM

## 2024-02-09 NOTE — Progress Notes (Signed)
   02/09/24 0925  Psych Admission Type (Psych Patients Only)  Admission Status Voluntary  Psychosocial Assessment  Patient Complaints Anxiety;Depression  Eye Contact Brief  Facial Expression Flat  Affect Irritable;Angry  Speech Logical/coherent  Interaction Guarded  Motor Activity Slow  Appearance/Hygiene Unremarkable  Behavior Characteristics Guarded;Irritable;Unwilling to participate  Mood Depressed;Irritable  Thought Process  Coherency WDL  Content WDL  Delusions None reported or observed  Perception WDL  Hallucination None reported or observed  Judgment Impaired  Confusion None  Danger to Self  Current suicidal ideation? Denies  Agreement Not to Harm Self Yes  Description of Agreement Verbal  Danger to Others  Danger to Others None reported or observed

## 2024-02-09 NOTE — Progress Notes (Signed)
 Pt engaged easily in conversation and discussed why she was irritable today.  Pt used profanity to describe her frustration in being told to not stand outside the dayroom.  Posed to pt to use this as a practice to communicate feelings in a more effective manner.  Pt's attempt to return demonstrate was blunt/direct and she stated, "would you open the dayroom?" Encouraged pt to be centered, proactive and not reactive.  Pt verbalized understanding and rested thereafter.   02/08/24 2300  Psych Admission Type (Psych Patients Only)  Admission Status Voluntary  Psychosocial Assessment  Patient Complaints Anxiety;Depression  Eye Contact Brief;Intense  Facial Expression Anxious;Sullen  Affect Anxious;Apprehensive;Constricted  Speech Logical/coherent;Rapid  Interaction Assertive;Cautious;Defensive;Guarded;Other (Comment) (Open to communicating)  Appearance/Hygiene Unremarkable  Behavior Characteristics Agressive verbally  Mood Anxious  Thought Process  Coherency WDL  Content WDL  Delusions None reported or observed  Perception WDL  Hallucination None reported or observed  Judgment Impaired  Confusion None  Danger to Self  Current suicidal ideation? Denies  Self-Injurious Behavior No self-injurious ideation or behavior indicators observed or expressed   Agreement Not to Harm Self Yes  Description of Agreement Verbal  Danger to Others  Danger to Others None reported or observed

## 2024-02-09 NOTE — Plan of Care (Signed)
  Problem: Activity: Goal: Sleeping patterns will improve Outcome: Progressing   Problem: Safety: Goal: Periods of time without injury will increase Outcome: Progressing   Problem: Physical Regulation: Goal: Ability to maintain clinical measurements within normal limits will improve Outcome: Progressing

## 2024-02-09 NOTE — Group Note (Signed)
 Date:  02/09/2024 Time:  1:08 PM  Group Topic/Focus:  Goals Group:   The focus of this group is to help patients establish daily goals to achieve during treatment and discuss how the patient can incorporate goal setting into their daily lives to aide in recovery. Orientation:   The focus of this group is to educate the patient on the purpose and policies of crisis stabilization and provide a format to answer questions about their admission.  The group details unit policies and expectations of patients while admitted.    Participation Level:  Did Not Attend  Participation Quality:   n/a  Affect:   n/a  Cognitive:   n/a  Insight: None  Engagement in Group:   n/a  Modes of Intervention:   n/a  Additional Comments:   Did not attend  Hollace Michelli M Zerah Hilyer 02/09/2024, 1:08 PM

## 2024-02-10 DIAGNOSIS — F3164 Bipolar disorder, current episode mixed, severe, with psychotic features: Secondary | ICD-10-CM | POA: Diagnosis not present

## 2024-02-10 MED ORDER — GABAPENTIN 100 MG PO CAPS
100.0000 mg | ORAL_CAPSULE | Freq: Three times a day (TID) | ORAL | Status: DC
  Administered 2024-02-10 – 2024-02-12 (×7): 100 mg via ORAL
  Filled 2024-02-10 (×14): qty 1

## 2024-02-10 MED ORDER — QUETIAPINE FUMARATE 50 MG PO TABS
50.0000 mg | ORAL_TABLET | Freq: Two times a day (BID) | ORAL | Status: DC
  Administered 2024-02-11 – 2024-02-12 (×4): 50 mg via ORAL
  Filled 2024-02-10 (×7): qty 1

## 2024-02-10 MED ORDER — QUETIAPINE FUMARATE 300 MG PO TABS
300.0000 mg | ORAL_TABLET | Freq: Every day | ORAL | Status: DC
  Administered 2024-02-10 – 2024-02-11 (×2): 300 mg via ORAL
  Filled 2024-02-10 (×4): qty 1

## 2024-02-10 MED ORDER — QUETIAPINE FUMARATE 50 MG PO TABS: 50.0000 mg | ORAL_TABLET | Freq: Two times a day (BID) | ORAL | Status: DC

## 2024-02-10 NOTE — Group Note (Signed)
 Date:  02/10/2024 Time:  4:33 PM  Group Topic/Focus:  Goals Group:   The focus of this group is to help patients establish daily goals to achieve during treatment and discuss how the patient can incorporate goal setting into their daily lives to aide in recovery. Orientation:   The focus of this group is to educate the patient on the purpose and policies of crisis stabilization and provide a format to answer questions about their admission.  The group details unit policies and expectations of patients while admitted.    Participation Level:  Did Not Attend  Participation Quality:   n/a  Affect:   n/a  Cognitive:   n/a  Insight: None  Engagement in Group:   n/a  Modes of Intervention:   n/a  Additional Comments:   Did not attend  Debbie [name] 02/10/2024, 4:33 PM

## 2024-02-10 NOTE — Progress Notes (Incomplete)
 Initial [facility] MD Progress Note  02/10/2024 8:50 AM Elliotte Marsalis  MRN:  [id number] Principal Problem: Bipolar disorder [facility]) Diagnosis: Principal Problem:   Bipolar disorder (HCC)  Total Time spent with patient: 45 minutes  Reason for Admission:  Celes Dedic is a 28 year old women with a psychiatric diagnosis of bipolar disorder. Denies medical history. She presents to [facility] voluntarily from [facility] for SI without plan. She was medically cleared and transferred to this facility via safe transport.    Chart review from last 24 hours: Chart reviewed today. Patient discussed at multidisciplinary team meeting. Vital signs within defined ranges. Documented sleep hours: 8.25. Nursing staff reported patient was irritable and using profanity in front of the dayroom last night. She is compliant with routine psychotropic medication regimen. Required as needed trazodone last night, according to the nursing record.     Daily Evaluation:  On assessment today, the patient was observed lying in bed asleep.  She reports feeling tired and states her energy levels remain low, with no improvement since admission.   Rates anxiety as 5/10 and depression as 6/10, with 10 being highest severity. Identifies stress as a primary trigger, citing "normal stuff." She reports no improvement in racing thoughts since admission. She reports waking up multiple times throughout the night. Patient reports previously being on gabapentin but was switched to Wellbutrin in addition to Cymbalta.  She states that Seroquel has been effective for her. She describes appetite is fair but states she does not like the food provided.  Denies suicidal ideation, self-harm urges, and homicidal ideation. Denies auditory or visual hallucinations, and paranoia.   Denies having side effects to current psychiatric medications.   Patient reports not attending any groups on the unit yesterday or thus far today. She was strongly  encouraged to participate in groups this afternoon to avoid room lockout, respectively.  The patient verbalized understanding.  Patient continues to experience persistent low energy, sleep disturbances, and racing thoughts. Anxiety and depression remain moderate, with no reported improvement since admission. Medication adjustments may be necessary to better manage symptoms. We will consider optimizing Seroquel dosage to improve sleep and reduce racing thoughts. No EPS symptoms were observed during the exam.       The  case was discussed with the attending psychiatrist, N. Abbott Pao; continued hospitalization remains necessary at this time.     Past Psychiatric History: See H&P  Past Medical History:  Past Medical History:  Diagnosis Date   Migraine    No pertinent past medical history    Urinary tract infection     Past Surgical History:  Procedure Laterality Date   APPENDECTOMY     Family History:  Family History  Problem Relation Age of Onset   Muscular dystrophy Cousin        Duchenne Muscular Dystrophy   Muscular dystrophy Cousin    Clotting disorder Mother        has a filter   Asthma Mother    Depression Mother    Hypertension Father    Family Psychiatric  History: See H&P Social History:  Social History   Substance and Sexual Activity  Alcohol Use No   Alcohol/week: 0.0 standard drinks of alcohol   Comment: social     Social History   Substance and Sexual Activity  Drug Use Yes   Types: Cocaine, Marijuana   Comment: fentanyl    Social History   Socioeconomic History   Marital status: Married    Spouse name:  Not on file   Number of children: Not on file   Years of education: Not on file   Highest education level: Not on file  Occupational History   Not on file  Tobacco Use   Smoking status: Some Days    Current packs/day: 1.00    Types: Cigarettes   Smokeless tobacco: Never  Vaping Use   Vaping status: Not on file  Substance and Sexual Activity    Alcohol use: No    Alcohol/week: 0.0 standard drinks of alcohol    Comment: social   Drug use: Yes    Types: Cocaine, Marijuana    Comment: fentanyl   Sexual activity: Yes    Partners: Male    Birth control/protection: OCP  Other Topics Concern   Not on file  Social History Narrative   Not on file   Social Drivers of Health   Financial Resource Strain: Not on file  Food Insecurity: Patient Declined (02/06/2024)   Hunger Vital Sign    Worried About Running Out of Food in the Last Year: Patient declined    Ran Out of Food in the Last Year: Patient declined  Transportation Needs: No Transportation Needs (02/06/2024)   PRAPARE - Administrator, Civil Service (Medical): No    Lack of Transportation (Non-Medical): No  Physical Activity: Not on file  Stress: Not on file  Social Connections: Not on file   Additional Social History:                         Current Medications: Current Facility-Administered Medications  Medication Dose Route Frequency Provider Last Rate Last Admin   alum & mag hydroxide-simeth (MAALOX/MYLANTA) 200-200-20 MG/5ML suspension 30 mL  30 mL Oral Q4H PRN Lauree Chandler, NP       haloperidol (HALDOL) tablet 5 mg  5 mg Oral TID PRN Lauree Chandler, NP       And   diphenhydrAMINE (BENADRYL) capsule 50 mg  50 mg Oral TID PRN Lauree Chandler, NP       haloperidol lactate (HALDOL) injection 5 mg  5 mg Intramuscular TID PRN Lauree Chandler, NP       And   diphenhydrAMINE (BENADRYL) injection 50 mg  50 mg Intramuscular TID PRN Lauree Chandler, NP       And   LORazepam (ATIVAN) injection 2 mg  2 mg Intramuscular TID PRN Lauree Chandler, NP       haloperidol lactate (HALDOL) injection 10 mg  10 mg Intramuscular TID PRN Lauree Chandler, NP       And   diphenhydrAMINE (BENADRYL) injection 50 mg  50 mg Intramuscular TID PRN Lauree Chandler, NP       And   LORazepam (ATIVAN) injection 2 mg  2 mg Intramuscular TID  PRN Lauree Chandler, NP       DULoxetine (CYMBALTA) DR capsule 60 mg  60 mg Oral Daily Valda Christenson H, NP   60 mg at 02/09/24 1104   hydrOXYzine (ATARAX) tablet 25 mg  25 mg Oral TID PRN Lauree Chandler, NP   25 mg at 02/07/24 1026   ibuprofen (ADVIL) tablet 600 mg  600 mg Oral Q8H PRN Ishia Tenorio H, NP   600 mg at 02/09/24 1803   magnesium hydroxide (MILK OF MAGNESIA) suspension 30 mL  30 mL Oral Daily PRN Lauree Chandler, NP       QUEtiapine (SEROQUEL) tablet  200 mg  200 mg Oral QHS Ntuen, Tina C, FNP   200 mg at 02/09/24 2211   QUEtiapine (SEROQUEL) tablet 25 mg  25 mg Oral BID NtuenJesusita Oka, FNP   25 mg at 02/09/24 1803   traZODone (DESYREL) tablet 50 mg  50 mg Oral QHS PRN Lauree Chandler, NP   50 mg at 02/09/24 2211    Lab Results:  No results found for this or any previous visit (from the past 48 hours).   Blood Alcohol level:  Lab Results  Component Value Date   ETH <10 02/05/2024    Metabolic Disorder Labs: Lab Results  Component Value Date   HGBA1C 5.8 (H) 02/07/2024   MPG 119.76 02/07/2024   No results found for: "PROLACTIN" Lab Results  Component Value Date   CHOL 141 02/07/2024   TRIG 116 02/07/2024   HDL 61 02/07/2024   CHOLHDL 2.3 02/07/2024   VLDL 23 02/07/2024   LDLCALC 57 02/07/2024    Physical Findings: AIMS:  , ,  0,  ,    CIWA:   N/A COWS:   N/A  Musculoskeletal: Strength & Muscle Tone: within normal limits Gait & Station: normal Patient leans: N/A  Psychiatric Specialty Exam:  Presentation  General Appearance:  Casual; Fairly Groomed  Eye Contact: Fair  Speech: Clear and Coherent; Normal Rate  Speech Volume: Decreased  Handedness: Right   Mood and Affect  Mood: Dysphoric  Affect: Flat; Depressed   Thought Process  Thought Processes: Coherent; Goal Directed; Linear  Descriptions of Associations:Intact  Orientation:Full (Time, Place and Person)  Thought Content:Logical  History of  Schizophrenia/Schizoaffective disorder:No  Duration of Psychotic Symptoms:No data recorded Hallucinations:Hallucinations: None   Ideas of Reference:None  Suicidal Thoughts:Suicidal Thoughts: No  Homicidal Thoughts:Homicidal Thoughts: No   Sensorium  Memory: Immediate Fair  Judgment: Fair  Insight: Fair   Art therapist  Concentration: Fair  Attention Span: Good  Recall: [employer] of Knowledge: Fair  Language: Fair   Psychomotor Activity  Psychomotor Activity: Psychomotor Activity: Normal   Assets  Assets: Communication Skills; Desire for Improvement; Resilience   Sleep  Sleep: Sleep: Fair    Physical Exam: Physical Exam Vitals and nursing note reviewed.  Constitutional:      General: She is not in acute distress.    Appearance: Normal appearance. She is not ill-appearing.  Cardiovascular:     Rate and Rhythm: Normal rate.     Pulses: Normal pulses.  Pulmonary:     Effort: No respiratory distress.  Skin:    Coloration: Skin is not pale.  Neurological:     Mental Status: She is alert and oriented to person, place, and time.    Review of Systems  Constitutional:  Negative for chills and fever.  Respiratory:  Negative for shortness of breath.   Cardiovascular:  Negative for chest pain and palpitations.  Gastrointestinal:  Negative for constipation, diarrhea, heartburn, nausea and vomiting.  Skin: Negative.   Neurological:  Negative for dizziness, tingling, tremors and headaches.  Psychiatric/Behavioral:  Positive for depression and substance abuse. Negative for hallucinations and suicidal ideas. The patient is nervous/anxious. The patient does not have insomnia.    Blood pressure 125/76, pulse 80, temperature 98.2 F (36.8 C), temperature source Oral, resp. rate 16, height 5\' 3"  (1.6 m), weight 59.9 kg, last menstrual period 01/22/2024, SpO2 100%. Body mass index is 23.38 kg/m.   Treatment Plan Summary: Daily contact with  patient to assess and evaluate symptoms and progress in treatment  and Medication management   Physician Treatment Plan for Primary Diagnosis:  Assessment: Manuelita Moxon is a 28 year old women with a psychiatric diagnosis of bipolar disorder. Denies medical history. She presents to [facility] voluntarily from [facility] for SI without plan. She was medically cleared and transferred to this facility via safe transport.    Principal Diagnosis: Bipolar disorder (HCC) Diagnosis:  Principal Problem:   Bipolar disorder (HCC)   Plan: Medications: -- Continue Seroquel 200 mg po at bedtime  -- Continue Seroquel 25 mg po BID    Continue As Needed Medications --Hydroxyzine 25 mg oral, 3 times daily as needed, anxiety --Trazodone 50 mg, oral, daily at bedtime as needed, sleep --Maalox/Mylanta 30 mL oral every 4 hours as needed, indigestion --Milk of Magnesia 30 mL oral daily as needed, mild constipation --- Motrin 600 mg every 6 hours, as needed, headache, mild pain   Continue BH Agitation Protocol --Haldol 5 mg, oral, 3 times daily as needed, mild agitation --Benadryl 50 mg, oral, 3 times daily as needed, mild agitation                                     OR  --Haldol injection 5 mg, IM, 3 times daily as needed, moderate agitation --Benadryl injection 50 mg, IM, 3 times daily as needed, moderate agitation --Ativan injection 2 mg, IM, 3 times daily as needed, moderate agitation                                     OR --Haldol injection 10 mg, IM, 3 times daily as needed, severe agitation --Benadryl injection 50 mg, IM, 3 times daily as needed, severe agitation --Ativan injection 2 mg, IM, 3 times daily as needed, severe agitation   --  The risks/benefits/side-effects/alternatives to this medication were discussed in detail with the patient and time was given for questions. The patient consents to medication trial.  -- FDA             -- Metabolic profile and EKG monitoring obtained  while on an atypical antipsychotic (BMI: Lipid Panel: HbgA1c: QTc:)              -- Encouraged patient to participate in unit milieu and in scheduled group therapies    --The risks/benefits/side-effects/alternatives to this medication were discussed in detail with the patient and time was given for questions. The patient consents to medication trial.  -- Metabolic profile and EKG monitoring obtained while on an atypical antipsychotic (BMI: Lipid Panel: HbgA1c: QTc:)  -- Encouraged patient to participate in unit milieu and in scheduled group therapies    Safety and Monitoring: - Voluntary admission to inpatient psychiatric unit for safety, stabilization and treatment - Daily contact with patient to assess and evaluate symptoms and progress in treatment - Patient's case to be discussed in multi-disciplinary team meeting - Observation Level : q15 minute checks - Vital signs: q12 hours - Precautions: suicide, but pt currently verbally contracts for safety on unit    Discharge Planning: - Social work and case management to assist with discharge planning and identification of hospital follow-up needs prior to discharge - Estimated LOS: 5-7 days - Discharge Concerns: Need to establish a safety plan; Medication compliance and effectiveness - Discharge Goals: Return home with outpatient referrals for mental health follow-up including medication management/psychotherapy.  Admission labs reviewed and significant for: Glucose 117 high, HGB 11.7 low, Platelets 423 high, UDS Cocaine (+) Cannabinoid (+)   New labs ordered: --Hemoglobin A1c elevated at 5.8 --Lipid panel within range -- Baseline ECG ordered    I certify that inpatient services furnished can reasonably be expected to improve the patient's condition.       Norma Fredrickson, NP 02/10/2024, 8:50 AM Patient ID: Toribio Harbour, female   DOB: February 14, 1996, 28 y.o.   MRN: [id number] Patient ID: Jataya Wann, female   DOB: 09/04/1996, 28 y.o.    MRN: [id number]
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
## 2024-02-10 NOTE — Group Note (Signed)
 [facility] LCSW Group Therapy Note  Date/Time:  02/10/2024/ 10:00-11:00  Type of Therapy and Topic:  Group Therapy:  Mindful and Mind Full   Participation Level:  None   Description of Group In this group patient will be asked to explore mindful and Mind full. Patient will be guided to discuss their thoughts, feelings, and behaviors related to mindful and mind full in themselves and others. Patient will process together how mindful and min full relate to how we form relationships with peers, family member, and self. Each patient will be challenged to identify and express feelings of being vulnerable. Patient will discuss reasons why people are mind full and identify alternative outcomes if one was mindful to self or others). This group will be process-oriented, with patients participating in explorations of their own experiences as well as giving and receiving support and challenged from other groups members.   Therapeutic Goals Patient will identify why honesty is important to relationships and how mindful overall affects the decision made Patient will identify a situation where their mind-full and process the feelings, thought process, and behaviors surrounding the situation. Patient will identify the meaning of being vulnerable, how that feels, and how that correlate to being mindful with self and others.  Summary of Patient Progress:   The patient came in the groups 50 minutes late. The patient did not share much in group but her presence   Therapeutic Modalities Cognitive Behavioral Therapy Motivational Interviewing

## 2024-02-10 NOTE — Progress Notes (Signed)
 After talking to NP, patient agreed to take morning meds  PATIENT SIGNED 72 HR REQUEST FOR DISCHARGE AT 1025  ON   02/10/2024.

## 2024-02-10 NOTE — Progress Notes (Signed)
 Patient has been locked out of her room today, has attended some of the groups and walked to dining room for lunch and dinner.

## 2024-02-10 NOTE — [facility] Suicide Risk Assessment (Signed)
 [facility] INPATIENT:  Family/Significant Other Suicide Prevention Education  Suicide Prevention Education:  Patient Refusal for Family/Significant Other Suicide Prevention Education: The patient Debbie [name] has refused to provide written consent for family/significant other to be provided Family/Significant Other Suicide Prevention Education during admission and/or prior to discharge.  Physician notified.  Carloyn Jaeger Grossman-Orr 02/10/2024, 11:15 AM

## 2024-02-10 NOTE — Progress Notes (Signed)
   02/10/24 2157  Psych Admission Type (Psych Patients Only)  Admission Status Voluntary  Psychosocial Assessment  Patient Complaints Anxiety  Eye Contact Brief  Facial Expression Flat  Affect Angry;Irritable  Speech Logical/coherent  Interaction Guarded;Minimal  Motor Activity Slow  Appearance/Hygiene Unremarkable  Behavior Characteristics Cooperative;Appropriate to situation  Mood Depressed  Thought Process  Coherency WDL  Content WDL  Delusions None reported or observed  Perception WDL  Hallucination None reported or observed  Judgment Poor  Confusion None  Danger to Self  Current suicidal ideation? Denies  Self-Injurious Behavior No self-injurious ideation or behavior indicators observed or expressed   Agreement Not to Harm Self Yes  Description of Agreement verbal  Danger to Others  Danger to Others None reported or observed

## 2024-02-10 NOTE — Plan of Care (Signed)
   Problem: Education: Goal: Knowledge of [facility] Education information/materials will improve Outcome: Progressing   Problem: Activity: Goal: Interest or engagement in activities will improve Outcome: Progressing   Problem: Coping: Goal: Ability to verbalize frustrations and anger appropriately will improve Outcome: Progressing

## 2024-02-10 NOTE — Progress Notes (Signed)
 Patient was very angry that she must attend groups, etc.  Patient came to med window and stated "I'm not taking this s___".

## 2024-02-10 NOTE — Group Note (Signed)
 Date:  02/10/2024 Time:  12:01 AM  Group Topic/Focus:  Wrap-Up Group:   The focus of this group is to help patients review their daily goal of treatment and discuss progress on daily workbooks.    Additional Comments:  Pt was encouraged, but opted out of attending wrap up group this evening.  Chrisandra Netters 02/10/2024, 12:01 AM

## 2024-02-10 NOTE — Progress Notes (Addendum)
 D:  Patient denied SI and HI, contracts for safety.  Denied A/V hallucinations. A:  Medications administered per MD orders after talking to SW about her morning medication refusal.   Emotional support and encouragement given patient. R:  Safety maintained with 15 minute checks.

## 2024-02-10 NOTE — Group Note (Signed)
 Date:  02/10/2024 Time:  10:39 PM  Group Topic/Focus:  Wrap-Up Group:   The focus of this group is to help patients review their daily goal of treatment and discuss progress on daily workbooks.    Participation Level:  Active  Participation Quality:  Appropriate and Attentive  Affect:  Appropriate  Cognitive:  Alert and Appropriate  Insight: Appropriate and Good  Engagement in Group:  Engaged  Modes of Intervention:  Discussion and Education  Additional Comments:  Pt attended and participated in wrap up group this evening and rated their day a 5 or 6/10. Pt reported that they have been in a "bad mood" most of the day due to their room lockout. Pt states that they are prepared to go home, but would benefit from MCD and housing resources.    Chrisandra Netters 02/10/2024, 10:39 PM

## 2024-02-10 NOTE — Group Note (Signed)
 Date:  02/10/2024 Time:  5:16 PM  Group Topic/Focus:  Rediscovering Joy:   The focus of this group is to explore various ways to relieve stress in a positive manner.    Participation Level:  Did Not Attend   Shela Nevin 02/10/2024, 5:16 PM

## 2024-02-10 NOTE — Progress Notes (Signed)
   02/09/24 2300  Psych Admission Type (Psych Patients Only)  Admission Status Voluntary  Psychosocial Assessment  Patient Complaints Irritability  Eye Contact Brief  Facial Expression Flat  Affect Angry;Irritable  Speech Logical/coherent  Interaction Guarded;Minimal  Motor Activity Slow  Appearance/Hygiene Unremarkable  Behavior Characteristics Guarded;Irritable  Mood Depressed  Thought Process  Coherency WDL  Content WDL  Delusions None reported or observed  Perception WDL  Hallucination None reported or observed  Judgment Poor  Confusion None  Danger to Self  Current suicidal ideation? Denies  Self-Injurious Behavior No self-injurious ideation or behavior indicators observed or expressed   Agreement Not to Harm Self Yes  Description of Agreement verbal  Danger to Others  Danger to Others None reported or observed

## 2024-02-10 NOTE — Plan of Care (Signed)
 Nurse discussed anxiety, depression and coping skills with patient.

## 2024-02-11 DIAGNOSIS — F3164 Bipolar disorder, current episode mixed, severe, with psychotic features: Secondary | ICD-10-CM | POA: Diagnosis not present

## 2024-02-11 NOTE — [facility] Group Notes (Signed)
 [facility] Group Notes:  (Nursing/MHT/Case Management/Adjunct)  Date:  02/11/2024  Time:  2000  Type of Therapy:   Wrap up group  Participation Level:  Active  Participation Quality:  Appropriate, Attentive, Sharing, and Supportive  Affect:  Appropriate  Cognitive:  Alert and Appropriate  Insight:  Appropriate and Good  Engagement in Group:  Engaged  Modes of Intervention:  Discussion  Summary of Progress/Problems:  Fay Records 02/11/2024, 9:13 PM

## 2024-02-11 NOTE — [facility] Suicide Risk Assessment (Signed)
 [facility] INPATIENT:  Family/Significant Other Suicide Prevention Education  Suicide Prevention Education:  Education Completed; with patient,  (name of family member/significant other) has been identified by the patient as the family member/significant other with whom the patient will be residing, and identified as the person(s) who will aid the patient in the event of a mental health crisis (suicidal ideations/suicide attempt).  With written consent from the patient, the family member/significant other has been provided the following suicide prevention education, prior to the and/or following the discharge of the patient.  CSW gave patient "Suicide Prevention Education" print-out.  Patient doesn't have any guns or weapons.  The suicide prevention education provided includes the following: Suicide risk factors Suicide prevention and interventions National Suicide Hotline telephone number [facility] Emergency Assistance 911  Request made of family/significant other to: Remove weapons (e.g., guns, rifles, knives), all items previously/currently identified as safety concern.   Remove drugs/medications (over-the-counter, prescriptions, illicit drugs), all items previously/currently identified as a safety concern.  The family member/significant other verbalizes understanding of the suicide prevention education information provided.  The family member/significant other agrees to remove the items of safety concern listed above.  Jamelyn Bovard O Natalie Leclaire, LCSWA 02/11/2024, 10:17 AM

## 2024-02-11 NOTE — Progress Notes (Signed)
 Initial [facility] MD Progress Note  02/11/2024 6:19 PM Debbie [name]  MRN:  [id number] Principal Problem: Bipolar disorder [facility]) Diagnosis: Principal Problem:   Bipolar disorder (HCC)  Total Time spent with patient: 45 minutes  Reason for Admission:  Debbie [name] is a 28 year old women with a psychiatric diagnosis of bipolar disorder. Denies medical history. She presents to [facility] voluntarily from [facility] for SI without plan. She was medically cleared and transferred to this facility via safe transport.    Chart review from last 24 hours: Chart reviewed today. Patient discussed at multidisciplinary team meeting. Vital signs within defined ranges. Documented sleep hours: 11.5. Nursing staff reported patient irritable and refused her routine psychotropic medication regimen this morning. Required as needed trazodone last night, according to the nursing record.  Expected discharge date 02/12/2024.  The patient signed a 72-hour discharge request on February 10, 2024 at 10:25 AM  Yesterday the psychiatry team made the following recommendations: - Initiate Gabapentin 100 mg, 3 times daily  -- Titrate Seroquel from 200 mg po to 300 mg daily at bedtime   -- Titrate Seroquel from 25 mg to 50 mg, oral, 2 times daily  Today's assessment notes: On assessment today, the pt reports that their mood is euthymic, improved since admission, and stable. Denies feeling down, depressed, or sad.  Reports that anxiety symptoms are at manageable level.  Sleep is stable. Appetite is stable.  Concentration is without complaint.  Energy level is adequate. Denies suicidal ideation, self-harm urges.  Denies having any HI.  Denies having psychotic symptoms.Denies auditory or visual hallucinations, and paranoia.    Denies having side effects to current psychiatric medications.   Discussed discharge planning: How to identify the signs of impending crisis, use of internal coping strategies, reaching out to  friends and family that can help navigate a crisis, and a list of mental health professionals and agencies to call. Further to follow up on her mental health appointments and her PCP appointments.  The  case was reviewed with the attending psychiatrist, N. Abbott Pao.  We will resume the patient's home medication of gabapentin at 100 mg 3 times daily.  However, due to concerns that Wellbutrin may contribute to mania, irritability, and mood swings, we will not reinstate that medication at this time.    We discussed changes to the current treatment plan:  -Continue gabapentin 100 mg, 3 times daily  -Continue Seroquel from 200 mg to 300 mg daily at bedtime   -Continue Seroquel from 25 mg to 50 mg, 2 times daily - Room Lockout during groups and meals to encourage participation, respectively. - No Roommate order placed to implement room lockout   Past Psychiatric History: See H&P  Past Medical History:  Past Medical History:  Diagnosis Date   Migraine    No pertinent past medical history    Urinary tract infection     Past Surgical History:  Procedure Laterality Date   APPENDECTOMY     Family History:  Family History  Problem Relation Age of Onset   Muscular dystrophy Cousin        Duchenne Muscular Dystrophy   Muscular dystrophy Cousin    Clotting disorder Mother        has a filter   Asthma Mother    Depression Mother    Hypertension Father    Family Psychiatric  History: See H&P Social History:  Social History   Substance and Sexual Activity  Alcohol Use No   Alcohol/week: 0.0  standard drinks of alcohol   Comment: social     Social History   Substance and Sexual Activity  Drug Use Yes   Types: Cocaine, Marijuana   Comment: fentanyl    Social History   Socioeconomic History   Marital status: Married    Spouse name: Not on file   Number of children: Not on file   Years of education: Not on file   Highest education level: Not on file  Occupational History   Not on  file  Tobacco Use   Smoking status: Some Days    Current packs/day: 1.00    Types: Cigarettes   Smokeless tobacco: Never  Vaping Use   Vaping status: Not on file  Substance and Sexual Activity   Alcohol use: No    Alcohol/week: 0.0 standard drinks of alcohol    Comment: social   Drug use: Yes    Types: Cocaine, Marijuana    Comment: fentanyl   Sexual activity: Yes    Partners: Male    Birth control/protection: OCP  Other Topics Concern   Not on file  Social History Narrative   Not on file   Social Drivers of Health   Financial Resource Strain: Not on file  Food Insecurity: Patient Declined (02/06/2024)   Hunger Vital Sign    Worried About Running Out of Food in the Last Year: Patient declined    Ran Out of Food in the Last Year: Patient declined  Transportation Needs: No Transportation Needs (02/06/2024)   PRAPARE - Administrator, Civil Service (Medical): No    Lack of Transportation (Non-Medical): No  Physical Activity: Not on file  Stress: Not on file  Social Connections: Not on file   Additional Social History:    Current Medications: Current Facility-Administered Medications  Medication Dose Route Frequency Provider Last Rate Last Admin   alum & mag hydroxide-simeth (MAALOX/MYLANTA) 200-200-20 MG/5ML suspension 30 mL  30 mL Oral Q4H PRN Lauree Chandler, NP       haloperidol (HALDOL) tablet 5 mg  5 mg Oral TID PRN Lauree Chandler, NP       And   diphenhydrAMINE (BENADRYL) capsule 50 mg  50 mg Oral TID PRN Lauree Chandler, NP       haloperidol lactate (HALDOL) injection 5 mg  5 mg Intramuscular TID PRN Lauree Chandler, NP       And   diphenhydrAMINE (BENADRYL) injection 50 mg  50 mg Intramuscular TID PRN Lauree Chandler, NP       And   LORazepam (ATIVAN) injection 2 mg  2 mg Intramuscular TID PRN Lauree Chandler, NP       haloperidol lactate (HALDOL) injection 10 mg  10 mg Intramuscular TID PRN Lauree Chandler, NP       And    diphenhydrAMINE (BENADRYL) injection 50 mg  50 mg Intramuscular TID PRN Lauree Chandler, NP       And   LORazepam (ATIVAN) injection 2 mg  2 mg Intramuscular TID PRN Lauree Chandler, NP       DULoxetine (CYMBALTA) DR capsule 60 mg  60 mg Oral Daily Bennett, Christal H, NP   60 mg at 02/11/24 0929   gabapentin (NEURONTIN) capsule 100 mg  100 mg Oral TID Willeen Cass, Christal H, NP   100 mg at 02/11/24 1312   hydrOXYzine (ATARAX) tablet 25 mg  25 mg Oral TID PRN Lauree Chandler, NP   25 mg at 02/07/24  1026   ibuprofen (ADVIL) tablet 600 mg  600 mg Oral Q8H PRN Bennett, Christal H, NP   600 mg at 02/09/24 1803   magnesium hydroxide (MILK OF MAGNESIA) suspension 30 mL  30 mL Oral Daily PRN Lauree Chandler, NP       QUEtiapine (SEROQUEL) tablet 300 mg  300 mg Oral QHS Bennett, Christal H, NP   300 mg at 02/10/24 2110   QUEtiapine (SEROQUEL) tablet 50 mg  50 mg Oral BID Bennett, Christal H, NP   50 mg at 02/11/24 1312   traZODone (DESYREL) tablet 50 mg  50 mg Oral QHS PRN Lauree Chandler, NP   50 mg at 02/10/24 2112    Lab Results:  No results found for this or any previous visit (from the past 48 hours).   Blood Alcohol level:  Lab Results  Component Value Date   ETH <10 02/05/2024    Metabolic Disorder Labs: Lab Results  Component Value Date   HGBA1C 5.8 (H) 02/07/2024   MPG 119.76 02/07/2024   No results found for: "PROLACTIN" Lab Results  Component Value Date   CHOL 141 02/07/2024   TRIG 116 02/07/2024   HDL 61 02/07/2024   CHOLHDL 2.3 02/07/2024   VLDL 23 02/07/2024   LDLCALC 57 02/07/2024    Physical Findings: AIMS:  , ,  0,  ,    CIWA:   N/A COWS:   N/A  Musculoskeletal: Strength & Muscle Tone: within normal limits Gait & Station: normal Patient leans: N/A  Psychiatric Specialty Exam:  Presentation  General Appearance:  Appropriate for Environment; Casual  Eye Contact: Good  Speech: Clear and Coherent  Speech  Volume: Normal  Handedness: Right   Mood and Affect  Mood: Anxious  Affect: Appropriate   Thought Process  Thought Processes: Coherent  Descriptions of Associations:Intact  Orientation:Full (Time, Place and Person)  Thought Content:Logical; WDL  History of Schizophrenia/Schizoaffective disorder:No  Duration of Psychotic Symptoms:No data recorded Hallucinations:Hallucinations: None   Ideas of Reference:None  Suicidal Thoughts:Suicidal Thoughts: No  Homicidal Thoughts:Homicidal Thoughts: No   Sensorium  Memory: Immediate Fair; Recent Fair  Judgment: Fair  Insight: Fair   Art therapist  Concentration: Fair  Attention Span: Fair  Recall: [employer] of Knowledge: Fair  Language: Fair   Psychomotor Activity  Psychomotor Activity: Psychomotor Activity: Normal   Assets  Assets: Communication Skills; Desire for Improvement; Physical Health; Resilience   Sleep  Sleep: Sleep: Good Number of Hours of Sleep: 7.5    Physical Exam: Physical Exam Vitals and nursing note reviewed.  Constitutional:      General: She is not in acute distress.    Appearance: Normal appearance. She is not ill-appearing.  HENT:     Head: Normocephalic.     Right Ear: External ear normal.     Left Ear: External ear normal.     Nose: Nose normal.     Mouth/Throat:     Mouth: Mucous membranes are moist.     Pharynx: Oropharynx is clear.  Eyes:     Extraocular Movements: Extraocular movements intact.  Cardiovascular:     Rate and Rhythm: Normal rate.     Pulses: Normal pulses.  Pulmonary:     Effort: Pulmonary effort is normal. No respiratory distress.  Abdominal:     Comments: Deferred  Genitourinary:    Comments: Deferred Musculoskeletal:        General: Normal range of motion.     Cervical back: Normal range of motion.  Skin:    General: Skin is warm.  Neurological:     General: No focal deficit present.     Mental Status: She is alert  and oriented to person, place, and time.  Psychiatric:        Mood and Affect: Mood normal.        Behavior: Behavior normal.    Review of Systems  Constitutional:  Negative for chills and fever.  HENT:  Negative for sore throat.   Eyes:  Negative for blurred vision.  Respiratory:  Negative for cough, sputum production, shortness of breath and wheezing.   Cardiovascular:  Negative for chest pain and palpitations.  Gastrointestinal:  Negative for constipation, diarrhea, heartburn, nausea and vomiting.  Genitourinary:  Negative for dysuria.  Musculoskeletal:  Negative for myalgias.  Skin: Negative.  Negative for itching and rash.  Neurological:  Negative for dizziness, tingling, tremors and headaches.  Endo/Heme/Allergies:        See allergy listing  Psychiatric/Behavioral:  Positive for depression and substance abuse. Negative for hallucinations and suicidal ideas. The patient is nervous/anxious. The patient does not have insomnia.    Blood pressure 102/63, pulse 75, temperature (!) 97.2 F (36.2 C), resp. rate 18, height 5\' 3"  (1.6 m), weight 59.9 kg, last menstrual period 01/22/2024, SpO2 100%. Body mass index is 23.38 kg/m.   Treatment Plan Summary: Daily contact with patient to assess and evaluate symptoms and progress in treatment and Medication management  Principal Diagnosis: Bipolar disorder (HCC) Diagnosis:  Principal Problem:   Bipolar disorder [facility])  Physician Treatment Plan for Primary Diagnosis:  Assessment: Debbie [name] is a 28 year old women with a psychiatric diagnosis of bipolar disorder. Denies medical history. She presents to [facility] voluntarily from [facility] for SI without plan. She was medically cleared and transferred to this facility via safe transport.     #Bipolar Disorder  -- Continue gabapentin 100 mg, 3 times daily  -- Continue Seroquel from 200 mg po to 300 mg daily at bedtime   -- Continue Seroquel from 25 mg to 50 mg, oral, 2 times  daily  # Room Lockout during groups and meals to encourage participation, respectively # No Roommate order placed to implement room lockout    Continue As Needed Medications --Hydroxyzine 25 mg oral, 3 times daily as needed, anxiety --Trazodone 50 mg, oral, daily at bedtime as needed, sleep --Maalox/Mylanta 30 mL oral every 4 hours as needed, indigestion --Milk of Magnesia 30 mL oral daily as needed, mild constipation --- Motrin 600 mg every 6 hours, as needed, headache, mild pain   Continue BH Agitation Protocol --Haldol 5 mg, oral, 3 times daily as needed, mild agitation --Benadryl 50 mg, oral, 3 times daily as needed, mild agitation                                     OR  --Haldol injection 5 mg, IM, 3 times daily as needed, moderate agitation --Benadryl injection 50 mg, IM, 3 times daily as needed, moderate agitation --Ativan injection 2 mg, IM, 3 times daily as needed, moderate agitation                                     OR --Haldol injection 10 mg, IM, 3 times daily as needed, severe agitation --Benadryl injection 50 mg, IM, 3  times daily as needed, severe agitation --Ativan injection 2 mg, IM, 3 times daily as needed, severe agitation   --  The risks/benefits/side-effects/alternatives to this medication were discussed in detail with the patient and time was given for questions. The patient consents to medication trial.  -- FDA             -- Metabolic profile and EKG monitoring obtained while on an atypical antipsychotic (BMI: Lipid Panel: HbgA1c: QTc:)              -- Encouraged patient to participate in unit milieu and in scheduled group therapies    --The risks/benefits/side-effects/alternatives to this medication were discussed in detail with the patient and time was given for questions. The patient consents to medication trial.  -- Metabolic profile and EKG monitoring obtained while on an atypical antipsychotic (BMI: Lipid Panel: HbgA1c: QTc:)  -- Encouraged patient to  participate in unit milieu and in scheduled group therapies    Safety and Monitoring: - Voluntary admission to inpatient psychiatric unit for safety, stabilization and treatment - Daily contact with patient to assess and evaluate symptoms and progress in treatment - Patient's case to be discussed in multi-disciplinary team meeting - Observation Level : q15 minute checks - Vital signs: q12 hours - Precautions: suicide, but pt currently verbally contracts for safety on unit    Discharge Planning: - Social work and case management to assist with discharge planning and identification of hospital follow-up needs prior to discharge - Estimated LOS: 5-7 days - Discharge Concerns: Need to establish a safety plan; Medication compliance and effectiveness - Discharge Goals: Return home with outpatient referrals for mental health follow-up including medication management/psychotherapy.   Admission labs reviewed and significant for: Glucose 117 high, HGB 11.7 low, Platelets 423 high, UDS Cocaine (+) Cannabinoid (+)   New labs ordered: --Hemoglobin A1c elevated at 5.8 --Lipid panel within range -- Baseline ECG ordered  I certify that inpatient services furnished can reasonably be expected to improve the patient's condition.     Cecilie Lowers, FNP 02/11/2024, 6:19 PM Patient ID: Toribio Harbour, female   DOB: February 09, 1996, 28 y.o.   MRN: [id number] Patient ID: Dilpreet Faires, female   DOB: Mar 22, 1996, 28 y.o.   MRN: [id number] Patient ID: Mery Guadalupe, female   DOB: Jan 05, 1996, 28 y.o.   MRN: [id number]

## 2024-02-11 NOTE — Group Note (Signed)
 Date:  02/11/2024 Time:  4:48 PM  Group Topic/Focus:  Emotional Education:   The focus of this group is to discuss what feelings/emotions are, and how they are experienced. Managing Feelings:   The focus of this group is to identify what feelings patients have difficulty handling and develop a plan to handle them in a healthier way upon discharge.    Participation Level:  Did Not Attend  Participation Quality:   n/a  Affect:   n/a  Cognitive:   n/a  Insight: None  Engagement in Group:   n/a  Modes of Intervention:   n/a  Additional Comments:   Did not attend  Stark Bray 02/11/2024, 4:48 PM

## 2024-02-11 NOTE — Group Note (Signed)
 Date:  02/11/2024 Time:  4:29 PM  Group Topic/Focus:  Goals Group:   The focus of this group is to help patients establish daily goals to achieve during treatment and discuss how the patient can incorporate goal setting into their daily lives to aide in recovery. Orientation:   The focus of this group is to educate the patient on the purpose and policies of crisis stabilization and provide a format to answer questions about their admission.  The group details unit policies and expectations of patients while admitted.    Participation Level:  Did Not Attend  Participation Quality:   n/a  Affect:   n/a  Cognitive:   n/a  Insight: None  Engagement in Group:   n/a  Modes of Intervention:   n/a  Additional Comments:   Did not attend  Stark Bray 02/11/2024, 4:29 PM

## 2024-02-11 NOTE — Group Note (Signed)
 Date:  02/11/2024 Time:  5:03 PM  Group Topic/Focus:  Self Care:   The focus of this group is to help patients understand the importance of self-care in order to improve or restore emotional, physical, spiritual, interpersonal, and financial health.    Participation Level:  Active  Participation Quality:  Appropriate  Affect:  Appropriate  Cognitive:  Appropriate  Insight: Appropriate  Engagement in Group:  Engaged  Modes of Intervention:  Education  Additional Comments:   Pt attended the Physical Wellness group.   Debbie [name] 02/11/2024, 5:03 PM

## 2024-02-11 NOTE — Group Note (Signed)
 Recreation Therapy Group Note   Group Topic:Leisure Education  Group Date: 02/11/2024 Start Time: 0930 End Time: 1000 Facilitators: Laketta Soderberg-McCall, LRT,CTRS Location: 300 [address] Dayroom   Group Topic: Leisure Education  Goal Area(s) Addresses:  Patient will identify positive leisure activities for use post discharge. Patient will identify at least one positive benefit of participation in leisure activities.   Intervention: Innovation, Group Presentation   Activity: LRT and patients set room in a way that would be effective in completing activity. Patients were seated in a circle and given a beach ball. Patients were instructed they were to hit the ball back and forth to each other as if playing volleyball. LRT would time the patients as they played the game. The ball could bounce or roll but not come to a complete stop. It the ball came to a stop at any time, the timer would reset and start over. Patients were to remain seated throughout activity but could get up if the ball escaped the circle at any point.  Education:  Leisure Scientist, physiological, Special educational needs teacher, Teamwork, Discharge Planning  Education Outcome: Acknowledges education/In group clarification offered/Needs additional education.    Affect/Mood: N/A   Participation Level: Did not attend    Clinical Observations/Individualized Feedback:      Plan: Continue to engage patient in RT group sessions 2-3x/week.   Garold Sheeler-McCall, LRT,CTRS 02/11/2024 1:00 PM

## 2024-02-11 NOTE — [facility] Suicide Risk Assessment (Addendum)
 [facility] INPATIENT:  Family/Significant Other Suicide Prevention Education  Suicide Prevention Education:  Education Completed; Debbie [name] (sister) [phone] ,  (name of family member/significant other) has been identified by the patient as the family member/significant other with whom the patient will be residing, and identified as the person(s) who will aid the patient in the event of a mental health crisis (suicidal ideations/suicide attempt).  With written consent from the patient, the family member/significant other has been provided the following suicide prevention education, prior to the and/or following the discharge of the patient.  Sister said that she will pick her up tomorrow, Tuesday, 3/25 after 3 PM.  Sister said that they don't have any guns or weapons.  She said that she will secure medications and sharp objects (such as knives or weapons).  When asked if she has any concerns, she said no, and added that patient needs aftercare "treatment."  CSW informed her that recommendation for aftercare services will be included on the discharge paperwork.  The following people live in the home:  sister and her 29 year old daughter.  The suicide prevention education provided includes the following: Suicide risk factors Suicide prevention and interventions National Suicide Hotline telephone number [facility] assessment telephone number [facility] Emergency Assistance 911 [facility] and/or Residential Mobile Crisis Unit telephone number  Request made of family/significant other to: Remove weapons (e.g., guns, rifles, knives), all items previously/currently identified as safety concern.   Remove drugs/medications (over-the-counter, prescriptions, illicit drugs), all items previously/currently identified as a safety concern.  The family member/significant other verbalizes understanding of the suicide prevention education information provided.  The family member/significant  other agrees to remove the items of safety concern listed above.  Debbie [name], LCSWA 02/11/2024, 12:12 PM

## 2024-02-11 NOTE — Plan of Care (Addendum)
 Pt denies SI, HI, AVH and pain when assessed. Noted to be very irritable with blunted affect on interactions, refused to come up for scheduled morning medication on 3 separate prompts "I will come up when I'm ready at 0930. Y'all are stressing me the fuck out". Rates her depression 0/10 and anxiety 2/10 with current stressor being staff "Y'all stressing me out. I told the doctor already that I'm ready to go". Became verbally abusive towards writer when redirected not to talk about another peer at the medication window in reference to group discussion "Just shut the fuck up, don't get into our damn conversation because nobody talking to you". Tolerated medications, meals and fluids well. Observed in scheduled groups in dayroom. Pt's mood remains labile at intervals. Safety checks maintained at Q 15 minutes intervals and she continues to need verbal redirections to comply with unit routines.   Problem: Safety: Goal: Periods of time without injury will increase Outcome: Progressing   Problem: Coping: Goal: Ability to verbalize frustrations and anger appropriately will improve Outcome: Not Progressing   Problem: Coping: Goal: Ability to demonstrate self-control will improve Outcome: Not Progressing

## 2024-02-12 DIAGNOSIS — F3164 Bipolar disorder, current episode mixed, severe, with psychotic features: Secondary | ICD-10-CM | POA: Diagnosis not present

## 2024-02-12 MED ORDER — DULOXETINE HCL 60 MG PO CPEP: 60.0000 mg | ORAL_CAPSULE | Freq: Every day | ORAL | 0 refills | Status: AC

## 2024-02-12 MED ORDER — HYDROXYZINE HCL 25 MG PO TABS: 25.0000 mg | ORAL_TABLET | Freq: Three times a day (TID) | ORAL | 0 refills | Status: AC | PRN

## 2024-02-12 MED ORDER — QUETIAPINE FUMARATE 50 MG PO TABS: 50.0000 mg | ORAL_TABLET | Freq: Two times a day (BID) | ORAL | 0 refills | Status: DC

## 2024-02-12 MED ORDER — TRAZODONE HCL 50 MG PO TABS: 50.0000 mg | ORAL_TABLET | Freq: Every evening | ORAL | 0 refills | Status: DC | PRN

## 2024-02-12 MED ORDER — GABAPENTIN 100 MG PO CAPS: 100.0000 mg | ORAL_CAPSULE | Freq: Three times a day (TID) | ORAL | 0 refills | Status: DC

## 2024-02-12 MED ORDER — QUETIAPINE FUMARATE 300 MG PO TABS: 300.0000 mg | ORAL_TABLET | Freq: Every day | ORAL | 0 refills | Status: DC

## 2024-02-12 NOTE — Progress Notes (Signed)
  [facility] Adult Case Management Discharge Plan :  Will you be returning to the same living situation after discharge:  No. Pt will be living with sister at discharge  At discharge, do you have transportation home?: Yes,  pt will be picked up by sister after 1500 Do you have the ability to pay for your medications: Yes,  pt has [employer] tailored plan medicaid   Release of information consent forms completed and in the chart;  Patient's signature needed at discharge.  Patient to Follow up at:  Follow-up Information     [facility] Follow up on 02/20/2024.   Why: You have a hospital follow up appointment for therapy and medication management services on  02/20/24 at 8:30 am.  This will be a Virtual telehealth appointment. Contact information: [address]  [address] [place] [address] [phone]                 Next level of care provider has access to [facility] Link:no  Safety Planning and Suicide Prevention discussed: Yes,  Lynnda Shields (sister) [phone]     Has patient been referred to the Quitline?: Patient refused referral for treatment  Patient has been referred for addiction treatment: No known substance use disorder.  Kathi Der, LCSWA 02/12/2024, 9:18 AM

## 2024-02-12 NOTE — Discharge Summary (Signed)
 Physician Discharge Summary Note  Patient:  Debbie [name] is an 28 y.o., female MRN:  [id number] DOB:  06-16-1996 Patient phone:  [phone] (home)  Patient address:   [address] [place] [address],  Total Time spent with patient: 45 minutes  Date of Admission:  02/06/2024 Date of Discharge:   02/12/2024  Reason for Admission:  Debbie [name] is a 28 year old women with a psychiatric diagnosis of bipolar disorder. Denies medical history. She presents to [facility] voluntarily from [facility] for SI without plan. She was medically cleared and transferred to this facility via safe transport.   Principal Problem: Bipolar disorder [facility]) Discharge Diagnoses: Principal Problem:   Bipolar disorder [facility])   Past Psychiatric History: Previous Psych Diagnoses: Depression, ADHD, Bipolar, PTSD Prior inpatient treatment:  - [facility] for SA OD on pills at 28 years old.  Debbie [name] in 2024 for SA. Did not explain the method used.  Current/prior outpatient treatment: Patient did not respond to question. Prior rehab hx: Patient did not respond to question. History of suicidal ideation: Patient did not respond to question. Chart review shows SI in May 2025 at [facility], and Jan 2024 at [facility]. History of homicide or aggression: Patient did not respond to question. Psychiatric medication history: Reports Cymbalta, Seroquel, Wellbutrin, Gabapentin. Psychiatric medication compliance history: Stopped taking medications.  Neuromodulation history: Patient did not respond to question. Current Psychiatrist: Denies. Current therapist: Denies.   Past Medical History:  Past Medical History:  Diagnosis Date   Migraine    No pertinent past medical history    Urinary tract infection     Past Surgical History:  Procedure Laterality Date   APPENDECTOMY     Family History:  Family History  Problem Relation Age of Onset   Muscular dystrophy Cousin        Duchenne Muscular Dystrophy    Muscular dystrophy Cousin    Clotting disorder Mother        has a filter   Asthma Mother    Depression Mother    Hypertension Father    Family Psychiatric  History: See H&P Social History:  Social History   Substance and Sexual Activity  Alcohol Use No   Alcohol/week: 0.0 standard drinks of alcohol   Comment: social     Social History   Substance and Sexual Activity  Drug Use Yes   Types: Cocaine, Marijuana   Comment: fentanyl    Social History   Socioeconomic History   Marital status: Married    Spouse name: Not on file   Number of children: Not on file   Years of education: Not on file   Highest education level: Not on file  Occupational History   Not on file  Tobacco Use   Smoking status: Some Days    Current packs/day: 1.00    Types: Cigarettes   Smokeless tobacco: Never  Vaping Use   Vaping status: Not on file  Substance and Sexual Activity   Alcohol use: No    Alcohol/week: 0.0 standard drinks of alcohol    Comment: social   Drug use: Yes    Types: Cocaine, Marijuana    Comment: fentanyl   Sexual activity: Yes    Partners: Male    Birth control/protection: OCP  Other Topics Concern   Not on file  Social History Narrative   Not on file   Social Drivers of Health   Financial Resource Strain: Not on file  Food Insecurity: Patient Declined (  02/06/2024)   Hunger Vital Sign    Worried About Running Out of Food in the Last Year: Patient declined    Ran Out of Food in the Last Year: Patient declined  Transportation Needs: No Transportation Needs (02/06/2024)   PRAPARE - Administrator, Civil Service (Medical): No    Lack of Transportation (Non-Medical): No  Physical Activity: Not on file  Stress: Not on file  Social Connections: Not on file   Hospital Course:  During the patient's hospitalization, patient had extensive initial psychiatric evaluation, and follow-up psychiatric evaluations every day.  Psychiatric diagnoses provided upon  initial assessment:  Principal Diagnosis: Bipolar disorder (HCC) Diagnosis:  Principal Problem:   Bipolar disorder (HCC)   Patient's psychiatric medications were adjusted on admission:  -- Initiate Seroquel 200 mg po at bedtime  -- Initiate Seroquel 25 mg po BID -- Continue Trazodone 50 mg po at bedtime prn sleep -- Continue Atarax 25 mg po TID prn anxiety   During the hospitalization, other adjustments were made to the patient's psychiatric medication regimen:  Seroquel tablets was increased from 25 mg p.o. twice daily to 50 mg p.o. twice daily for manic depression.   Seroquel tablets was increased from 200 mg p.o. q. nightly to 300 mg p.o. q. nightly for manic depression Gabapentin 100 mg p.o. 3 times daily for anxiety was initiated for anxiety Cymbalta 60 mg p.o. daily was initiated for depression  Patient's care was discussed during the interdisciplinary team meeting every day during the hospitalization.  The patient denies having side effects to prescribed psychiatric medication.  Gradually, patient started adjusting to milieu. The patient was evaluated each day by a clinical provider to ascertain response to treatment. Improvement was noted by the patient's report of decreasing symptoms, improved sleep and appetite, affect, medication tolerance, behavior, and participation in unit programming.  Patient was asked each day to complete a self inventory noting mood, mental status, pain, new symptoms, anxiety and concerns.    Symptoms were reported as significantly decreased or resolved completely by discharge.   On day of discharge, the patient reports that their mood is stable. The patient denied having suicidal thoughts for more than 48 hours prior to discharge.  Patient denies having homicidal thoughts.  Patient denies having auditory hallucinations.  Patient denies any visual hallucinations or other symptoms of psychosis. The patient was motivated to continue taking medication with a  goal of continued improvement in mental health.   The patient reports her target psychiatric symptoms of manic depression responded well to the psychiatric medications, and the patient reports overall benefit other psychiatric hospitalization. Supportive psychotherapy was provided to the patient. The patient also participated in regular group therapy while hospitalized. Coping skills, problem solving as well as relaxation therapies were also part of the unit programming.  Labs were reviewed with the patient, and abnormal results were discussed with the patient.  The patient is able to verbalize their individual safety plan to this provider.  # It is recommended to the patient to continue psychiatric medications as prescribed, after discharge from the hospital.    # It is recommended to the patient to follow up with your outpatient psychiatric provider and PCP.  # It was discussed with the patient, the impact of alcohol, drugs, tobacco have been there overall psychiatric and medical wellbeing, and total abstinence from substance use was recommended the patient.ed.  # Prescriptions provided or sent directly to preferred pharmacy at discharge. Patient agreeable to plan. Given opportunity to  ask questions. Appears to feel comfortable with discharge.    # In the event of worsening symptoms, the patient is instructed to call the crisis hotline, 911 and or go to the nearest ED for appropriate evaluation and treatment of symptoms. To follow-up with primary care provider for other medical issues, concerns and or health care needs  # Patient was discharged to home with her sister with a plan to follow up as noted below.   Physical Findings: AIMS:  , ,  ,  ,    CIWA:    COWS:     Musculoskeletal: Strength & Muscle Tone: within normal limits Gait & Station: normal Patient leans: N/A   Psychiatric Specialty Exam:  Presentation  General Appearance:  Casual  Eye  Contact: Good  Speech: Normal Rate; Clear and Coherent  Speech Volume: Normal  Handedness: Right  Mood and Affect  Mood: Euthymic  Affect: Appropriate; Congruent  Thought Process  Thought Processes: Coherent; Linear  Descriptions of Associations:Intact  Orientation:Full (Time, Place and Person)  Thought Content:Logical  History of Schizophrenia/Schizoaffective disorder:No  Duration of Psychotic Symptoms:No data recorded Hallucinations:Hallucinations: None  Ideas of Reference:None  Suicidal Thoughts:Suicidal Thoughts: No  Homicidal Thoughts:Homicidal Thoughts: No  Sensorium  Memory: Immediate Good; Recent Good  Judgment: Fair  Insight: Fair  Art therapist  Concentration: Good  Attention Span: Good  Recall: Fair  Fund of Knowledge: Fair  Language: Good  Psychomotor Activity  Psychomotor Activity: Psychomotor Activity: Normal  Assets  Assets: Communication Skills; Physical Health; Resilience  Sleep  Sleep: Sleep: Good Number of Hours of Sleep: 7.25  Physical Exam: Physical Exam Vitals and nursing note reviewed.  Constitutional:      Appearance: Normal appearance.  HENT:     Head: Normocephalic.     Nose: Nose normal.     Mouth/Throat:     Mouth: Mucous membranes are moist.     Pharynx: Oropharynx is clear.  Eyes:     Extraocular Movements: Extraocular movements intact.  Cardiovascular:     Rate and Rhythm: Normal rate.     Pulses: Normal pulses.  Pulmonary:     Effort: Pulmonary effort is normal.  Abdominal:     Comments: Deferred  Genitourinary:    Comments: Deferred Musculoskeletal:        General: Normal range of motion.     Cervical back: Normal range of motion.  Skin:    General: Skin is warm.  Neurological:     General: No focal deficit present.     Mental Status: She is alert and oriented to person, place, and time.  Psychiatric:        Mood and Affect: Mood normal.        Behavior: Behavior  normal.        Thought Content: Thought content normal.    Review of Systems  Constitutional:  Negative for chills and fever.  HENT:  Negative for sore throat.   Eyes:  Negative for blurred vision.  Respiratory:  Negative for cough, sputum production, shortness of breath and wheezing.   Cardiovascular:  Negative for chest pain and palpitations.  Gastrointestinal:  Negative for abdominal pain, constipation, diarrhea, heartburn, nausea and vomiting.  Genitourinary:  Negative for dysuria, frequency and urgency.  Musculoskeletal:  Negative for myalgias.  Skin:  Negative for itching and rash.  Neurological:  Negative for dizziness and headaches.  Endo/Heme/Allergies:        See allergy listing  Psychiatric/Behavioral:  Positive for depression (Stable with medication). Negative for hallucinations,  substance abuse and suicidal ideas. The patient is nervous/anxious (Improved with medication). The patient does not have insomnia.    Blood pressure 103/74, pulse 87, temperature 99.1 F (37.3 C), temperature source Oral, resp. rate 18, height 5\' 3"  (1.6 m), weight 59.9 kg, last menstrual period 01/22/2024, SpO2 100%. Body mass index is 23.38 kg/m.   Social History   Tobacco Use  Smoking Status Some Days   Current packs/day: 1.00   Types: Cigarettes  Smokeless Tobacco Never   Tobacco Cessation:  A prescription for an FDA-approved tobacco cessation medication was offered at discharge and the patient refused  Blood Alcohol level:  Lab Results  Component Value Date   ETH <10 02/05/2024   Metabolic Disorder Labs:  Lab Results  Component Value Date   HGBA1C 5.8 (H) 02/07/2024   MPG 119.76 02/07/2024   No results found for: "PROLACTIN" Lab Results  Component Value Date   CHOL 141 02/07/2024   TRIG 116 02/07/2024   HDL 61 02/07/2024   CHOLHDL 2.3 02/07/2024   VLDL 23 02/07/2024   LDLCALC 57 02/07/2024   See Psychiatric Specialty Exam and Suicide Risk Assessment completed by  Attending Physician prior to discharge.  Discharge destination:  Home  Is patient on multiple antipsychotic therapies at discharge:  No   Has Patient had three or more failed trials of antipsychotic monotherapy by history:  No  Recommended Plan for Multiple Antipsychotic Therapies: NA  Discharge Instructions     Diet - low sodium heart healthy   Complete by: As directed    Increase activity slowly   Complete by: As directed    Increase activity slowly   Complete by: As directed       Allergies as of 02/12/2024       Reactions   Clindamycin/lincomycin    Shellfish Allergy Swelling   Nystatin Rash        Medication List     STOP taking these medications    amoxicillin 500 MG capsule Commonly known as: AMOXIL   doxycycline 100 MG capsule Commonly known as: VIBRAMYCIN   metroNIDAZOLE 500 MG tablet Commonly known as: FLAGYL       TAKE these medications      Indication  DULoxetine 60 MG capsule Commonly known as: CYMBALTA Take 1 capsule (60 mg total) by mouth daily. Start taking on: February 13, 2024  Indication: Major Depressive Disorder   gabapentin 100 MG capsule Commonly known as: NEURONTIN Take 1 capsule (100 mg total) by mouth 3 (three) times daily.  Indication: Neuropathic Pain   hydrOXYzine 25 MG tablet Commonly known as: ATARAX Take 1 tablet (25 mg total) by mouth 3 (three) times daily as needed.  Indication: Feeling Anxious   QUEtiapine 300 MG tablet Commonly known as: SEROQUEL Take 1 tablet (300 mg total) by mouth at bedtime. What changed:  medication strength how much to take  Indication: Depressive Phase of Manic-Depression   QUEtiapine 50 MG tablet Commonly known as: SEROQUEL Take 1 tablet (50 mg total) by mouth 2 (two) times daily. What changed:  medication strength how much to take  Indication: Depressive Phase of Manic-Depression   traZODone 50 MG tablet Commonly known as: DESYREL Take 1 tablet (50 mg total) by mouth at  bedtime as needed for sleep.  Indication: Trouble Sleeping        Follow-up Information     [facility] Follow up on 02/20/2024.   Why: You have a hospital follow up appointment for therapy and medication management services on  02/20/24 at 8:30 am.  This will be a Virtual telehealth appointment. Contact information: [address]  [address] [place] [address] [phone]                Follow-up recommendations:   Discharge Recommendations:  The patient is being discharged with her family. Patient is to take her discharge medications as ordered.  See follow up above. We recommend that she participates in individual therapy to target uncontrollable agitation and substance abuse.  We recommend that she participates in therapy to target the conflict with her family, to improve communication skills and conflict resolution skills.  patient is to initiate/implement a contingency based behavioral model to address patient's behavior. We recommend that she gets AIMS scale, height, weight, blood pressure, fasting lipid panel, fasting blood sugar in three months from discharge if she's on atypical antipsychotics.  Patient will benefit from monitoring of recurrent suicidal ideation since patient is on antidepressant medication. The patient should abstain from all illicit substances and alcohol. If the patient's symptoms worsen or do not continue to improve or if the patient becomes actively suicidal or homicidal then it is recommended that the patient return to the closest hospital emergency room or call 911 for further evaluation and treatment. National Suicide Prevention Lifeline 1800-SUICIDE or (204)109-1322. Please follow up with your primary medical doctor for all other medical needs.  The patient has been educated on the possible side effects to medications and she/her guardian is to contact a medical professional and inform outpatient provider of any new side effects of  medication. She is to take regular diet and activity as tolerated.  Will benefit from moderate daily exercise. Patient was educated about removing/locking any firearms, medications or dangerous products from the home.   Activity:  As tolerated Diet:  Regular Diet  Signed:  Cecilie Lowers, FNP 02/12/2024, 11:15 AM

## 2024-02-12 NOTE — [facility] Suicide Risk Assessment (Signed)
 Suicide Risk Assessment  Discharge Assessment    [employer] Discharge Suicide Risk Assessment   Principal Problem: Bipolar disorder [facility]) Discharge Diagnoses: Principal Problem:   Bipolar disorder (HCC)  Reason for admission:   Debbie [name] is a 28 year old women with a psychiatric diagnosis of bipolar disorder. Denies medical history. She presents to [facility] voluntarily from [facility] for SI without plan. She was medically cleared and transferred to this facility via safe transport.   Total Time spent with patient: 45 minutes  Musculoskeletal: Strength & Muscle Tone: within normal limits Gait & Station: normal Patient leans: N/A  Psychiatric Specialty Exam  Presentation  General Appearance:  Casual  Eye Contact: Good  Speech: Normal Rate; Clear and Coherent  Speech Volume: Normal  Handedness: Right  Mood and Affect  Mood: Euthymic  Duration of Depression Symptoms: Greater than two weeks  Affect: Appropriate; Congruent  Thought Process  Thought Processes: Coherent; Linear  Descriptions of Associations:Intact  Orientation:Full (Time, Place and Person)  Thought Content:Logical  History of Schizophrenia/Schizoaffective disorder:No  Duration of Psychotic Symptoms:No data recorded Hallucinations:Hallucinations: None  Ideas of Reference:None  Suicidal Thoughts:Suicidal Thoughts: No  Homicidal Thoughts:Homicidal Thoughts: No  Sensorium  Memory: Immediate Good; Recent Good  Judgment: Fair  Insight: Fair  Art therapist  Concentration: Good  Attention Span: Good  Recall: Fair  Fund of Knowledge: Fair  Language: Good  Psychomotor Activity  Psychomotor Activity: Psychomotor Activity: Normal  Assets  Assets: Communication Skills; Physical Health; Resilience  Sleep  Sleep: Sleep: Good Number of Hours of Sleep: 7.25  Physical Exam: Physical Exam Vitals and nursing note reviewed.  Constitutional:      General:  She is not in acute distress.    Appearance: She is not ill-appearing or toxic-appearing.  HENT:     Head: Normocephalic.     Right Ear: External ear normal.     Left Ear: External ear normal.     Nose: Nose normal.     Mouth/Throat:     Mouth: Mucous membranes are moist.     Pharynx: Oropharynx is clear.  Eyes:     Extraocular Movements: Extraocular movements intact.  Cardiovascular:     Rate and Rhythm: Normal rate.     Pulses: Normal pulses.  Pulmonary:     Effort: Pulmonary effort is normal. No respiratory distress.  Abdominal:     Comments: Deferred  Genitourinary:    Comments: Deferred Musculoskeletal:        General: Normal range of motion.     Cervical back: Normal range of motion.  Skin:    General: Skin is warm.  Neurological:     General: No focal deficit present.     Mental Status: She is alert and oriented to person, place, and time.     Motor: No weakness.     Gait: Gait normal.  Psychiatric:        Mood and Affect: Mood normal.        Behavior: Behavior normal.        Thought Content: Thought content normal.    Review of Systems  Constitutional:  Negative for chills and fever.  HENT:  Negative for sore throat.   Eyes:  Negative for blurred vision.  Respiratory:  Negative for cough, sputum production, shortness of breath and wheezing.   Cardiovascular:  Negative for chest pain and palpitations.  Gastrointestinal:  Negative for abdominal pain, constipation, diarrhea, heartburn, nausea and vomiting.  Genitourinary:  Negative for dysuria, frequency and urgency.  Musculoskeletal:  Negative for myalgias.  Skin:  Negative for itching and rash.  Neurological:  Negative for dizziness and headaches.  Endo/Heme/Allergies:        See allergy listing  Psychiatric/Behavioral:  Positive for depression (Stable with medication). Negative for hallucinations, substance abuse and suicidal ideas. The patient is nervous/anxious (Improved with medication).    Blood  pressure 103/74, pulse 87, temperature 99.1 F (37.3 C), temperature source Oral, resp. rate 18, height 5\' 3"  (1.6 m), weight 59.9 kg, last menstrual period 01/22/2024, SpO2 100%. Body mass index is 23.38 kg/m.  Mental Status Per Nursing Assessment::   On Admission:  NA  Demographic Factors:  Adolescent or young adult, Low socioeconomic status, and Unemployed  Loss Factors: Financial problems/change in socioeconomic status  Historical Factors: Prior suicide attempts and Impulsivity  Risk Reduction Factors:   Living with another person, especially a relative, Positive social support, Positive therapeutic relationship, and Positive coping skills or problem solving skills  Continued Clinical Symptoms:  Bipolar Disorder:   Depressive phase Depression:   Impulsivity Recent sense of peace/wellbeing More than one psychiatric diagnosis Unstable or Poor Therapeutic Relationship Previous Psychiatric Diagnoses and Treatments  Cognitive Features That Contribute To Risk:  Polarized thinking    Suicide Risk:  Mild: There are no identifiable plans, no associated intent, mild dysphoria and related symptoms, good self-control (both objective and subjective assessment), few other risk factors, and identifiable protective factors, including available and accessible social support.   Follow-up Information     [facility] Follow up on 02/20/2024.   Why: You have a hospital follow up appointment for therapy and medication management services on  02/20/24 at 8:30 am.  This will be a Virtual telehealth appointment. Contact information: [address]  [address] [place] [address] [phone]                Plan Of Care/Follow-up recommendations:  Discharge Recommendations:  The patient is being discharged with her family. Patient is to take her discharge medications as ordered.  See follow up above. We recommend that she participates in individual therapy to target uncontrollable  agitation and substance abuse.  We recommend that she participates in therapy to target the conflict with her family, to improve communication skills and conflict resolution skills.  patient is to initiate/implement a contingency based behavioral model to address patient's behavior. We recommend that she gets AIMS scale, height, weight, blood pressure, fasting lipid panel, fasting blood sugar in three months from discharge if she's on atypical antipsychotics.  Patient will benefit from monitoring of recurrent suicidal ideation since patient is on antidepressant medication. The patient should abstain from all illicit substances and alcohol. If the patient's symptoms worsen or do not continue to improve or if the patient becomes actively suicidal or homicidal then it is recommended that the patient return to the closest hospital emergency room or call 911 for further evaluation and treatment. National Suicide Prevention Lifeline 1800-SUICIDE or 910-283-6862. Please follow up with your primary medical doctor for all other medical needs.  The patient has been educated on the possible side effects to medications and she/her guardian is to contact a medical professional and inform outpatient provider of any new side effects of medication. She is to take regular diet and activity as tolerated.  Will benefit from moderate daily exercise. Patient was educated about removing/locking any firearms, medications or dangerous products from the home.  Activity:  As tolerated Diet:  Regular Diet  Cecilie Lowers, FNP 02/12/2024, 11:01 AM

## 2024-02-12 NOTE — Discharge Instructions (Signed)
-  Follow-up with your outpatient psychiatric provider -instructions on appointment date, time, and address (location) are provided to you in discharge paperwork.  -Take your psychiatric medications as prescribed at discharge - instructions are provided to you in the discharge paperwork As we discussed, we will provide printed prescriptions at discharge - take these to the pharmacy of your choosing to have your medications filled.   -Follow-up with outpatient primary care doctor and other specialists -for management of preventative medicine and any chronic medical disease.  -Recommend abstinence from alcohol, tobacco, and other illicit drug use at discharge.   -If your psychiatric symptoms recur, worsen, or if you have side effects to your psychiatric medications, call your outpatient psychiatric provider, 911, [phone] or go to the nearest emergency department.  -If suicidal thoughts occur, call your outpatient psychiatric provider, 911, [phone] or go to the nearest emergency department.  Naloxone (Narcan) can help reverse an overdose when given to the victim quickly.  [facility] offers free naloxone kits and instructions/training on its use.  Add naloxone to your first aid kit and you can help save a life.   Pick up your free kit at the following locations:   [place]:  [facility], [address] [place] [address] [phone]) [facility] [address] [place] [address] [phone]) [facility] [address] [place] [address]  [place]: [facility] [address] [place] [address] ([phone]) [facility] [address] [place] [address] [phone])

## 2024-02-12 NOTE — Progress Notes (Signed)
   02/11/24 2102  Psych Admission Type (Psych Patients Only)  Admission Status Voluntary  Psychosocial Assessment  Patient Complaints Other (Comment) (Content at this time w/no complaints, communicated effectively discussing goals.)  Eye Contact Fair  Facial Expression Flat  Affect Appropriate to circumstance  Speech Logical/coherent  Interaction Assertive  Motor Activity Slow  Appearance/Hygiene Unremarkable  Behavior Characteristics Cooperative  Mood Pleasant  Aggressive Behavior  Effect No apparent injury  Thought Process  Coherency WDL  Content WDL ("When I discjharge I need to find a job. I need to get my GED first. I would like to draw labs.")  Delusions None reported or observed  Perception WDL  Hallucination None reported or observed  Judgment Limited  Confusion None  Danger to Self  Current suicidal ideation? Denies  Self-Injurious Behavior No self-injurious ideation or behavior indicators observed or expressed   Agreement Not to Harm Self Yes  Description of Agreement Verbal contract  Danger to Others  Danger to Others None reported or observed

## 2024-02-12 NOTE — Progress Notes (Signed)
   02/12/24 0600  15 Minute Checks  Location Bedroom  Visual Appearance Calm  Behavior Sleeping  Sleep (Behavioral Health Patients Only)  Calculate sleep? (Click Yes once per 24 hr at 0600 safety check) Yes  Documented sleep last 24 hours 7.25

## 2024-02-12 NOTE — [facility] Group Notes (Signed)

## 2024-02-12 NOTE — Plan of Care (Signed)

## 2024-02-12 NOTE — Group Note (Signed)
 Recreation Therapy Group Note   Group Topic:Animal Assisted Therapy   Group Date: 02/12/2024 Start Time: 0945 End Time: 1031 Facilitators: Lamir Racca-McCall, LRT,CTRS Location: [address] Dayroom   Animal-Assisted Activity (AAA) Program Checklist/Progress Notes Patient Eligibility Criteria Checklist & Daily Group note for Rec Tx Intervention  AAA/T Program Assumption of Risk Form signed by Patient/ or Parent Legal Guardian Yes  Patient is free of allergies or severe asthma Yes  Patient reports no fear of animals Yes  Patient reports no history of cruelty to animals Yes  Patient understands his/her participation is voluntary Yes  Patient washes hands before animal contact Yes  Patient washes hands after animal contact Yes  Education: Hand Washing, Appropriate Animal Interaction   Education Outcome: Acknowledges education.    Affect/Mood: Appropriate   Participation Level: Engaged   Participation Quality: Independent   Behavior: Appropriate   Speech/Thought Process: Focused   Insight: Good   Judgement: Good   Modes of Intervention: Teaching laboratory technician   Patient Response to Interventions:  Engaged   Education Outcome:  In group clarification offered    Clinical Observations/Individualized Feedback: Patient attended session and interacted appropriately with therapy dog and peers. Patient asked appropriate questions about therapy dog and his training. Patient shared stories about their pets at home with group.      Plan: Continue to engage patient in RT group sessions 2-3x/week.   Debbie [name], LRT,CTRS 02/12/2024 1:45 PM

## 2024-02-12 NOTE — Final Progress Note (Signed)
 D: Pt A & O X 3. Denies SI, HI, AVH and pain at this time. D/C home as ordered. Picked up in lobby by her sister. A: D/C instructions reviewed with pt including prescriptions follow up appointments; compliance encouraged. All belongings from locker 13 to pt at time of departure. Scheduled  medications given with verbal education and effects monitored. Safety checks maintained without incident till time of d/c.  R: Pt receptive to care. Compliant with medications when offered. Denies adverse drug reactions when assessed. Verbalized understanding related to d/c instructions. Signed belonging sheet in agreement with items received from locker. Ambulatory with a steady gait. Appears to be in no physical distress at time of departure.

## 2024-02-12 NOTE — Group Note (Signed)
 LCSW Group Therapy Note   Group Date: 02/12/2024 Start Time: 1100 End Time: 1200  Participation:  did not attend  Type of Therapy:  Group Therapy  Title:  Speaking from the Heart: Communicating with Understanding and Empathy  Objective:  To help participants develop effective communication skills to express themselves clearly, listen actively, and navigate conflicts in a healthy way.  Goals: Increase awareness of verbal and non-verbal communication skills. Practice using "I" statements and active listening techniques. Learn coping strategies for managing communication stress.  Summary:  Participants explored the importance of communication, discussed challenges, and practiced skills such as active listening and assertive expression. They reflected on past experiences and identified ways to improve communication in their daily lives.  Therapeutic Modalities: Cognitive-Behavioral Therapy (CBT): Restructuring negative thought patterns in communication. Mindfulness: Staying present and calm during conversations.   Alla Feeling, LCSWA 02/12/2024  12:50 PM

## 2024-03-17 ENCOUNTER — Emergency Department (HOSPITAL_COMMUNITY)
Admission: EM | Admit: 2024-03-17 | Discharge: 2024-03-17 | Disposition: A | Discharge status: Home / Self Care | Payer: MEDICAID | Attending: Emergency Medicine | Admitting: Emergency Medicine

## 2024-03-17 ENCOUNTER — Encounter (HOSPITAL_COMMUNITY): Payer: Self-pay

## 2024-03-17 ENCOUNTER — Other Ambulatory Visit: Payer: Self-pay

## 2024-03-17 DIAGNOSIS — F411 Generalized anxiety disorder: Secondary | ICD-10-CM | POA: Diagnosis not present

## 2024-03-17 DIAGNOSIS — Z760 Encounter for issue of repeat prescription: Secondary | ICD-10-CM | POA: Diagnosis present

## 2024-03-17 DIAGNOSIS — F419 Anxiety disorder, unspecified: Secondary | ICD-10-CM

## 2024-03-17 MED ORDER — QUETIAPINE FUMARATE 50 MG PO TABS
50.0000 mg | ORAL_TABLET | Freq: Two times a day (BID) | ORAL | 0 refills | Status: AC
Start: 2024-03-17 — End: 2024-04-16

## 2024-03-17 MED ORDER — TRAZODONE HCL 50 MG PO TABS: 50.0000 mg | ORAL_TABLET | Freq: Every evening | ORAL | 0 refills | Status: AC | PRN

## 2024-03-17 MED ORDER — QUETIAPINE FUMARATE 300 MG PO TABS: 300.0000 mg | ORAL_TABLET | Freq: Every day | ORAL | 0 refills | Status: AC

## 2024-03-17 MED ORDER — GABAPENTIN 100 MG PO CAPS
100.0000 mg | ORAL_CAPSULE | Freq: Three times a day (TID) | ORAL | 0 refills | Status: AC
Start: 2024-03-17 — End: 2024-04-16

## 2024-03-17 NOTE — Discharge Instructions (Signed)
 See your local physician or psychiatry team for refills and reassessment. Return for new concerns.

## 2024-03-17 NOTE — ED Triage Notes (Signed)
 Pt requesting refill of her seroquel , trazodone  and gabapentin . Pt states she's in between PCPs and has an appointment coming up but she's been out for about 3 days and it starting to feel anxious. Denies SI/HI

## 2024-03-17 NOTE — ED Provider Notes (Signed)
 Debbie [name]   CSN: [id number] Arrival date & time: 03/17/24  2004     History  Chief Complaint  Patient presents with   Medication Refill    Debbie [name] is a 28 y.o. female.  Patient presents for refill of Seroquel  trazodone  and gabapentin .  Patient was recently in behavioral health followed by [facility].  Patient has no suicidal or homicidal ideation.  Patient feels safe going home.  Patient says she has follow-up appointment this week.  The history is provided by the patient.  Medication Refill      Home Medications Prior to Admission medications   Medication Sig Start Date End Date Taking? Authorizing Provider  DULoxetine  (CYMBALTA ) 60 MG capsule Take 1 capsule (60 mg total) by mouth daily. 02/13/24 03/14/24  Massengill, Elana Grayer, MD  gabapentin  (NEURONTIN ) 100 MG capsule Take 1 capsule (100 mg total) by mouth 3 (three) times daily. 03/17/24 04/16/24  Clay Cummins, MD  hydrOXYzine  (ATARAX ) 25 MG tablet Take 1 tablet (25 mg total) by mouth 3 (three) times daily as needed. 02/12/24 03/13/24  Massengill, Elana Grayer, MD  QUEtiapine  (SEROQUEL ) 300 MG tablet Take 1 tablet (300 mg total) by mouth at bedtime. 03/17/24 04/16/24  Clay Cummins, MD  QUEtiapine  (SEROQUEL ) 50 MG tablet Take 1 tablet (50 mg total) by mouth 2 (two) times daily. 03/17/24 04/16/24  Clay Cummins, MD  traZODone  (DESYREL ) 50 MG tablet Take 1 tablet (50 mg total) by mouth at bedtime as needed for sleep. 03/17/24   Clay Cummins, MD      Allergies    Clindamycin/lincomycin, Shellfish allergy, and Nystatin     Review of Systems   Review of Systems  Constitutional:  Negative for chills and fever.  HENT:  Negative for congestion.   Eyes:  Negative for visual disturbance.  Respiratory:  Negative for shortness of breath.   Cardiovascular:  Negative for chest pain.  Gastrointestinal:  Negative for abdominal pain and vomiting.  Genitourinary:  Negative for dysuria and  flank pain.  Musculoskeletal:  Negative for back pain, neck pain and neck stiffness.  Skin:  Negative for rash.  Neurological:  Negative for light-headedness and headaches.  Psychiatric/Behavioral:  The patient is nervous/anxious.     Physical Exam Updated Vital Signs BP 129/74 (BP Location: Left Arm)   Pulse (!) 116 Comment: ptis crying  Temp 98 F (36.7 C) (Oral)   Resp 18   Ht 5\' 3"  (1.6 m)   Wt 62.6 kg   LMP 01/29/2024 (Approximate)   SpO2 98%   BMI 24.45 kg/m  Physical Exam Vitals and nursing [name] reviewed.  Constitutional:      General: She is not in acute distress.    Appearance: She is well-developed.  HENT:     Head: Normocephalic and atraumatic.     Mouth/Throat:     Mouth: Mucous membranes are moist.  Eyes:     General:        Right eye: No discharge.        Left eye: No discharge.     Conjunctiva/sclera: Conjunctivae normal.  Neck:     Trachea: No tracheal deviation.  Cardiovascular:     Rate and Rhythm: Regular rhythm. Tachycardia present.  Pulmonary:     Effort: Pulmonary effort is normal.     Breath sounds: Normal breath sounds.  Abdominal:     General: There is no distension.     Palpations: Abdomen is soft.     Tenderness: There is no  abdominal tenderness. There is no guarding.  Musculoskeletal:     Cervical back: Normal range of motion and neck supple. No rigidity.  Skin:    General: Skin is warm.     Capillary Refill: Capillary refill takes less than 2 seconds.     Findings: No rash.  Neurological:     General: No focal deficit present.     Mental Status: She is alert.     Cranial Nerves: No cranial nerve deficit.  Psychiatric:        Mood and Affect: Mood is anxious.        Speech: Speech is not rapid and pressured.        Thought Content: Thought content does not include homicidal or suicidal ideation. Thought content does not include homicidal or suicidal plan.     Comments: Anxious, mild agitated however calm during answering  questions.      ED Results / Procedures / Treatments   Labs (all labs ordered are listed, but only abnormal results are displayed) Labs Reviewed - No data to display  EKG None  Radiology No results found.  Procedures Procedures    Medications Ordered in ED Medications - No data to display  ED Course/ Medical Decision Making/ A&P                                 Medical Decision Making Risk Prescription drug management.   Patient presents for medication refill and clinically anxious.  Tachycardic likely secondary to anxiety.  Patient denies any infectious symptoms, cardiac symptoms or respiratory.  Medical records reviewed and refilled medications for 30 days patient has close outpatient follow-up.  Patient stable for discharge at this time.        Final Clinical Impression(s) / ED Diagnoses Final diagnoses:  Medication refill  Anxious mood    Rx / DC Orders ED Discharge Orders          Ordered    gabapentin  (NEURONTIN ) 100 MG capsule  3 times daily        03/17/24 2249    QUEtiapine  (SEROQUEL ) 300 MG tablet  Daily at bedtime        03/17/24 2249    QUEtiapine  (SEROQUEL ) 50 MG tablet  2 times daily        03/17/24 2249    traZODone  (DESYREL ) 50 MG tablet  At bedtime PRN        03/17/24 2249              La Shehan, MD 03/17/24 2254

## 2024-05-17 DIAGNOSIS — F319 Bipolar disorder, unspecified: Secondary | ICD-10-CM | POA: Diagnosis not present

## 2024-05-17 DIAGNOSIS — N939 Abnormal uterine and vaginal bleeding, unspecified: Secondary | ICD-10-CM | POA: Diagnosis not present

## 2024-05-17 DIAGNOSIS — R112 Nausea with vomiting, unspecified: Secondary | ICD-10-CM | POA: Diagnosis not present

## 2024-05-17 DIAGNOSIS — D250 Submucous leiomyoma of uterus: Secondary | ICD-10-CM | POA: Diagnosis not present

## 2024-05-17 DIAGNOSIS — R1031 Right lower quadrant pain: Secondary | ICD-10-CM | POA: Diagnosis not present

## 2024-05-17 DIAGNOSIS — F419 Anxiety disorder, unspecified: Secondary | ICD-10-CM | POA: Diagnosis not present

## 2024-05-17 DIAGNOSIS — F17210 Nicotine dependence, cigarettes, uncomplicated: Secondary | ICD-10-CM | POA: Diagnosis not present

## 2024-06-18 DIAGNOSIS — F4312 Post-traumatic stress disorder, chronic: Secondary | ICD-10-CM | POA: Diagnosis not present

## 2024-07-11 DIAGNOSIS — F4312 Post-traumatic stress disorder, chronic: Secondary | ICD-10-CM | POA: Diagnosis not present

## 2024-07-14 DIAGNOSIS — F4312 Post-traumatic stress disorder, chronic: Secondary | ICD-10-CM | POA: Diagnosis not present
